# Patient Record
Sex: Female | Born: 1965 | Race: Black or African American | Hispanic: No | Marital: Single | Smoking: Current every day smoker
Health system: Southern US, Community
[De-identification: ages and names within clinical notes are randomized; demographics above are authoritative.]

## PROBLEM LIST (undated history)

## (undated) DIAGNOSIS — F332 Major depressive disorder, recurrent severe without psychotic features: Secondary | ICD-10-CM

## (undated) DIAGNOSIS — T391X1A Poisoning by 4-Aminophenol derivatives, accidental (unintentional), initial encounter: Secondary | ICD-10-CM

## (undated) DIAGNOSIS — K219 Gastro-esophageal reflux disease without esophagitis: Secondary | ICD-10-CM

## (undated) DIAGNOSIS — R45851 Suicidal ideations: Secondary | ICD-10-CM

## (undated) DIAGNOSIS — F319 Bipolar disorder, unspecified: Secondary | ICD-10-CM

## (undated) DIAGNOSIS — I10 Essential (primary) hypertension: Secondary | ICD-10-CM

## (undated) DIAGNOSIS — M76899 Other specified enthesopathies of unspecified lower limb, excluding foot: Secondary | ICD-10-CM

## (undated) HISTORY — DX: Poisoning by 4-aminophenol derivatives, accidental (unintentional), initial encounter: T39.1X1A

## (undated) HISTORY — DX: Other specified enthesopathies of unspecified lower limb, excluding foot: M76.899

## (undated) HISTORY — DX: Suicidal ideations: R45.851

## (undated) HISTORY — DX: Major depressive disorder, recurrent severe without psychotic features: F33.2

## (undated) HISTORY — DX: Essential (primary) hypertension: I10

## (undated) HISTORY — PX: TONSILLECTOMY: SUR1361

## (undated) HISTORY — PX: BREAST SURGERY: SHX581

---

## 1998-05-22 ENCOUNTER — Emergency Department (HOSPITAL_COMMUNITY): Admission: EM | Admit: 1998-05-22 | Discharge: 1998-05-23 | Payer: Self-pay | Admitting: Emergency Medicine

## 1998-10-09 ENCOUNTER — Emergency Department (HOSPITAL_COMMUNITY): Admission: EM | Admit: 1998-10-09 | Discharge: 1998-10-09 | Payer: Self-pay | Admitting: Emergency Medicine

## 2000-02-01 ENCOUNTER — Emergency Department (HOSPITAL_COMMUNITY): Admission: EM | Admit: 2000-02-01 | Discharge: 2000-02-01 | Payer: Self-pay | Admitting: Emergency Medicine

## 2000-02-03 ENCOUNTER — Encounter: Admission: RE | Admit: 2000-02-03 | Discharge: 2000-02-03 | Payer: Self-pay | Admitting: Emergency Medicine

## 2000-02-03 ENCOUNTER — Encounter: Payer: Self-pay | Admitting: Emergency Medicine

## 2000-09-14 ENCOUNTER — Emergency Department (HOSPITAL_COMMUNITY): Admission: EM | Admit: 2000-09-14 | Discharge: 2000-09-14 | Payer: Self-pay | Admitting: Emergency Medicine

## 2000-12-15 ENCOUNTER — Emergency Department (HOSPITAL_COMMUNITY): Admission: EM | Admit: 2000-12-15 | Discharge: 2000-12-15 | Payer: Self-pay | Admitting: Emergency Medicine

## 2001-02-14 ENCOUNTER — Encounter: Admission: RE | Admit: 2001-02-14 | Discharge: 2001-02-14 | Payer: Self-pay | Admitting: Emergency Medicine

## 2001-02-14 ENCOUNTER — Encounter: Payer: Self-pay | Admitting: Emergency Medicine

## 2001-02-27 ENCOUNTER — Encounter: Payer: Self-pay | Admitting: Emergency Medicine

## 2001-02-27 ENCOUNTER — Emergency Department (HOSPITAL_COMMUNITY): Admission: EM | Admit: 2001-02-27 | Discharge: 2001-02-27 | Payer: Self-pay | Admitting: Emergency Medicine

## 2002-01-30 ENCOUNTER — Emergency Department (HOSPITAL_COMMUNITY): Admission: EM | Admit: 2002-01-30 | Discharge: 2002-01-30 | Payer: Self-pay | Admitting: Emergency Medicine

## 2002-07-02 ENCOUNTER — Emergency Department (HOSPITAL_COMMUNITY): Admission: EM | Admit: 2002-07-02 | Discharge: 2002-07-02 | Payer: Self-pay | Admitting: Emergency Medicine

## 2002-07-08 ENCOUNTER — Emergency Department (HOSPITAL_COMMUNITY): Admission: EM | Admit: 2002-07-08 | Discharge: 2002-07-08 | Payer: Self-pay | Admitting: Emergency Medicine

## 2002-07-23 ENCOUNTER — Encounter: Admission: RE | Admit: 2002-07-23 | Discharge: 2002-07-23 | Payer: Self-pay | Admitting: Internal Medicine

## 2002-07-24 ENCOUNTER — Encounter: Payer: Self-pay | Admitting: Internal Medicine

## 2002-07-24 ENCOUNTER — Ambulatory Visit (HOSPITAL_COMMUNITY): Admission: RE | Admit: 2002-07-24 | Discharge: 2002-07-24 | Payer: Self-pay | Admitting: Internal Medicine

## 2002-10-30 ENCOUNTER — Encounter: Admission: RE | Admit: 2002-10-30 | Discharge: 2002-10-30 | Payer: Self-pay | Admitting: Internal Medicine

## 2003-06-03 ENCOUNTER — Encounter: Admission: RE | Admit: 2003-06-03 | Discharge: 2003-06-03 | Payer: Self-pay | Admitting: Internal Medicine

## 2003-07-01 ENCOUNTER — Encounter: Admission: RE | Admit: 2003-07-01 | Discharge: 2003-07-01 | Payer: Self-pay | Admitting: Internal Medicine

## 2003-08-19 ENCOUNTER — Encounter: Admission: RE | Admit: 2003-08-19 | Discharge: 2003-08-19 | Payer: Self-pay | Admitting: Internal Medicine

## 2003-10-15 ENCOUNTER — Encounter: Admission: RE | Admit: 2003-10-15 | Discharge: 2003-10-15 | Payer: Self-pay | Admitting: Internal Medicine

## 2003-10-21 ENCOUNTER — Ambulatory Visit (HOSPITAL_COMMUNITY): Admission: RE | Admit: 2003-10-21 | Discharge: 2003-10-21 | Payer: Self-pay | Admitting: Internal Medicine

## 2003-12-01 ENCOUNTER — Encounter: Admission: RE | Admit: 2003-12-01 | Discharge: 2003-12-01 | Payer: Self-pay | Admitting: Internal Medicine

## 2004-01-14 ENCOUNTER — Ambulatory Visit (HOSPITAL_COMMUNITY): Admission: RE | Admit: 2004-01-14 | Discharge: 2004-01-14 | Payer: Self-pay | Admitting: *Deleted

## 2004-01-21 ENCOUNTER — Encounter: Admission: RE | Admit: 2004-01-21 | Discharge: 2004-01-21 | Payer: Self-pay | Admitting: *Deleted

## 2004-01-21 ENCOUNTER — Encounter (INDEPENDENT_AMBULATORY_CARE_PROVIDER_SITE_OTHER): Payer: Self-pay | Admitting: *Deleted

## 2004-01-29 ENCOUNTER — Encounter: Admission: RE | Admit: 2004-01-29 | Discharge: 2004-01-29 | Payer: Self-pay | Admitting: Internal Medicine

## 2004-02-09 ENCOUNTER — Encounter: Admission: RE | Admit: 2004-02-09 | Discharge: 2004-02-09 | Payer: Self-pay | Admitting: Internal Medicine

## 2004-10-19 ENCOUNTER — Ambulatory Visit: Payer: Self-pay | Admitting: Internal Medicine

## 2004-11-15 ENCOUNTER — Ambulatory Visit: Payer: Self-pay | Admitting: Internal Medicine

## 2004-12-20 ENCOUNTER — Ambulatory Visit (HOSPITAL_BASED_OUTPATIENT_CLINIC_OR_DEPARTMENT_OTHER): Admission: RE | Admit: 2004-12-20 | Discharge: 2004-12-20 | Payer: Self-pay | Admitting: Internal Medicine

## 2005-01-01 ENCOUNTER — Ambulatory Visit: Payer: Self-pay | Admitting: Internal Medicine

## 2006-10-05 ENCOUNTER — Ambulatory Visit: Payer: Self-pay | Admitting: Internal Medicine

## 2006-10-08 ENCOUNTER — Ambulatory Visit: Payer: Self-pay | Admitting: *Deleted

## 2006-10-10 ENCOUNTER — Encounter: Admission: RE | Admit: 2006-10-10 | Discharge: 2006-10-10 | Payer: Self-pay | Admitting: Internal Medicine

## 2006-12-14 ENCOUNTER — Ambulatory Visit: Payer: Self-pay | Admitting: Internal Medicine

## 2006-12-14 ENCOUNTER — Encounter (INDEPENDENT_AMBULATORY_CARE_PROVIDER_SITE_OTHER): Payer: Self-pay | Admitting: Internal Medicine

## 2006-12-14 LAB — CONVERTED CEMR LAB
Bilirubin Urine: NEGATIVE
Chlamydia, DNA Probe: NEGATIVE
Leukocytes, UA: NEGATIVE
Protein, ur: NEGATIVE mg/dL
Specific Gravity, Urine: 1.03
Urine Glucose: NEGATIVE mg/dL
Urobilinogen, UA: 0.2

## 2006-12-20 ENCOUNTER — Ambulatory Visit (HOSPITAL_COMMUNITY): Admission: RE | Admit: 2006-12-20 | Discharge: 2006-12-20 | Payer: Self-pay | Admitting: Internal Medicine

## 2007-01-03 ENCOUNTER — Encounter (INDEPENDENT_AMBULATORY_CARE_PROVIDER_SITE_OTHER): Payer: Self-pay | Admitting: General Surgery

## 2007-01-03 ENCOUNTER — Encounter (INDEPENDENT_AMBULATORY_CARE_PROVIDER_SITE_OTHER): Payer: Self-pay | Admitting: Internal Medicine

## 2007-01-03 ENCOUNTER — Ambulatory Visit (HOSPITAL_COMMUNITY): Admission: RE | Admit: 2007-01-03 | Discharge: 2007-01-03 | Payer: Self-pay | Admitting: General Surgery

## 2007-01-17 ENCOUNTER — Ambulatory Visit: Payer: Self-pay | Admitting: Internal Medicine

## 2007-01-17 DIAGNOSIS — M76899 Other specified enthesopathies of unspecified lower limb, excluding foot: Secondary | ICD-10-CM

## 2007-01-17 HISTORY — DX: Other specified enthesopathies of unspecified lower limb, excluding foot: M76.899

## 2007-01-17 LAB — CONVERTED CEMR LAB
HCT: 38.9 % (ref 36.0–46.0)
Lymphs Abs: 2.1 10*3/uL (ref 0.7–3.3)
MCV: 92.8 fL (ref 78.0–100.0)
Monocytes Absolute: 0.3 10*3/uL (ref 0.2–0.7)
Monocytes Relative: 4 % (ref 3–11)
Neutro Abs: 3.5 10*3/uL (ref 1.7–7.7)
Neutrophils Relative %: 60 % (ref 43–77)

## 2007-01-18 ENCOUNTER — Encounter (INDEPENDENT_AMBULATORY_CARE_PROVIDER_SITE_OTHER): Payer: Self-pay | Admitting: Internal Medicine

## 2007-01-18 LAB — CONVERTED CEMR LAB
Albumin: 3.9 g/dL (ref 3.5–5.2)
BUN: 12 mg/dL (ref 6–23)
CO2: 24 meq/L (ref 19–32)
Calcium: 8.9 mg/dL (ref 8.4–10.5)
Chloride: 107 meq/L (ref 96–112)
Glucose, Bld: 90 mg/dL (ref 70–99)
Potassium: 4.8 meq/L (ref 3.5–5.3)
Sodium: 143 meq/L (ref 135–145)
Total Protein: 6.7 g/dL (ref 6.0–8.3)

## 2007-01-23 ENCOUNTER — Encounter (INDEPENDENT_AMBULATORY_CARE_PROVIDER_SITE_OTHER): Payer: Self-pay | Admitting: Internal Medicine

## 2007-01-23 LAB — CONVERTED CEMR LAB
Amylase: 63 units/L (ref 0–105)
Lipase: 36 units/L (ref 0–75)

## 2007-01-25 ENCOUNTER — Ambulatory Visit (HOSPITAL_COMMUNITY): Admission: RE | Admit: 2007-01-25 | Discharge: 2007-01-25 | Payer: Self-pay | Admitting: Internal Medicine

## 2007-01-25 DIAGNOSIS — D219 Benign neoplasm of connective and other soft tissue, unspecified: Secondary | ICD-10-CM

## 2007-01-25 DIAGNOSIS — N83209 Unspecified ovarian cyst, unspecified side: Secondary | ICD-10-CM

## 2007-01-29 ENCOUNTER — Encounter: Admission: RE | Admit: 2007-01-29 | Discharge: 2007-01-29 | Payer: Self-pay | Admitting: Internal Medicine

## 2007-01-29 ENCOUNTER — Encounter (INDEPENDENT_AMBULATORY_CARE_PROVIDER_SITE_OTHER): Payer: Self-pay | Admitting: Interventional Radiology

## 2007-01-29 ENCOUNTER — Other Ambulatory Visit: Admission: RE | Admit: 2007-01-29 | Discharge: 2007-01-29 | Payer: Self-pay | Admitting: Interventional Radiology

## 2007-02-05 ENCOUNTER — Encounter: Admission: RE | Admit: 2007-02-05 | Discharge: 2007-03-12 | Payer: Self-pay | Admitting: Internal Medicine

## 2007-02-05 ENCOUNTER — Encounter (INDEPENDENT_AMBULATORY_CARE_PROVIDER_SITE_OTHER): Payer: Self-pay | Admitting: Internal Medicine

## 2007-02-11 ENCOUNTER — Encounter: Payer: Self-pay | Admitting: Internal Medicine

## 2007-02-11 DIAGNOSIS — E042 Nontoxic multinodular goiter: Secondary | ICD-10-CM

## 2007-02-11 DIAGNOSIS — M545 Low back pain, unspecified: Secondary | ICD-10-CM | POA: Insufficient documentation

## 2007-02-11 DIAGNOSIS — K219 Gastro-esophageal reflux disease without esophagitis: Secondary | ICD-10-CM | POA: Insufficient documentation

## 2007-02-11 DIAGNOSIS — F319 Bipolar disorder, unspecified: Secondary | ICD-10-CM

## 2007-02-11 DIAGNOSIS — E669 Obesity, unspecified: Secondary | ICD-10-CM | POA: Insufficient documentation

## 2007-02-11 DIAGNOSIS — R945 Abnormal results of liver function studies: Secondary | ICD-10-CM

## 2007-02-11 DIAGNOSIS — G4730 Sleep apnea, unspecified: Secondary | ICD-10-CM | POA: Insufficient documentation

## 2007-02-11 DIAGNOSIS — I10 Essential (primary) hypertension: Secondary | ICD-10-CM

## 2007-03-20 ENCOUNTER — Ambulatory Visit: Payer: Self-pay | Admitting: Obstetrics and Gynecology

## 2007-04-18 ENCOUNTER — Ambulatory Visit: Payer: Self-pay | Admitting: Internal Medicine

## 2007-04-25 LAB — CONVERTED CEMR LAB
AST: 55 units/L — ABNORMAL HIGH (ref 0–37)
Albumin: 4.1 g/dL (ref 3.5–5.2)
Bilirubin, Direct: 0.1 mg/dL (ref 0.0–0.3)
Total Bilirubin: 0.4 mg/dL (ref 0.3–1.2)

## 2007-05-09 ENCOUNTER — Encounter (INDEPENDENT_AMBULATORY_CARE_PROVIDER_SITE_OTHER): Payer: Self-pay | Admitting: Internal Medicine

## 2007-05-09 ENCOUNTER — Ambulatory Visit (HOSPITAL_BASED_OUTPATIENT_CLINIC_OR_DEPARTMENT_OTHER): Admission: RE | Admit: 2007-05-09 | Discharge: 2007-05-09 | Payer: Self-pay | Admitting: Internal Medicine

## 2007-05-10 ENCOUNTER — Ambulatory Visit (HOSPITAL_COMMUNITY): Admission: RE | Admit: 2007-05-10 | Discharge: 2007-05-10 | Payer: Self-pay | Admitting: Internal Medicine

## 2007-05-23 ENCOUNTER — Encounter (INDEPENDENT_AMBULATORY_CARE_PROVIDER_SITE_OTHER): Payer: Self-pay | Admitting: Internal Medicine

## 2007-06-02 ENCOUNTER — Encounter (INDEPENDENT_AMBULATORY_CARE_PROVIDER_SITE_OTHER): Payer: Self-pay | Admitting: Internal Medicine

## 2007-06-07 ENCOUNTER — Ambulatory Visit: Payer: Self-pay | Admitting: Internal Medicine

## 2007-06-26 ENCOUNTER — Telehealth (INDEPENDENT_AMBULATORY_CARE_PROVIDER_SITE_OTHER): Payer: Self-pay | Admitting: Internal Medicine

## 2007-07-12 ENCOUNTER — Ambulatory Visit: Payer: Self-pay | Admitting: Internal Medicine

## 2007-07-16 ENCOUNTER — Ambulatory Visit (HOSPITAL_COMMUNITY): Admission: RE | Admit: 2007-07-16 | Discharge: 2007-07-16 | Payer: Self-pay | Admitting: Internal Medicine

## 2007-08-06 ENCOUNTER — Ambulatory Visit: Payer: Self-pay | Admitting: Internal Medicine

## 2007-08-29 ENCOUNTER — Ambulatory Visit: Payer: Self-pay | Admitting: Nurse Practitioner

## 2007-08-29 DIAGNOSIS — B9789 Other viral agents as the cause of diseases classified elsewhere: Secondary | ICD-10-CM | POA: Insufficient documentation

## 2007-08-29 LAB — CONVERTED CEMR LAB
BUN: 10 mg/dL (ref 6–23)
CO2: 23 meq/L (ref 19–32)
Calcium: 9.4 mg/dL (ref 8.4–10.5)
Creatinine, Ser: 0.74 mg/dL (ref 0.40–1.20)
Eosinophils Absolute: 0.1 10*3/uL (ref 0.0–0.7)
Eosinophils Relative: 6 % — ABNORMAL HIGH (ref 0–5)
Glucose, Bld: 82 mg/dL (ref 70–99)
HCT: 44.2 % (ref 36.0–46.0)
Hemoglobin: 14.5 g/dL (ref 12.0–15.0)
Lymphocytes Relative: 40 % (ref 12–46)
Lymphs Abs: 0.8 10*3/uL (ref 0.7–4.0)
MCV: 96.3 fL (ref 78.0–100.0)
Monocytes Absolute: 0.5 10*3/uL (ref 0.1–1.0)
Monocytes Relative: 26 % — ABNORMAL HIGH (ref 3–12)
Sodium: 142 meq/L (ref 135–145)
WBC: 2 10*3/uL — ABNORMAL LOW (ref 4.0–10.5)

## 2007-08-30 ENCOUNTER — Telehealth (INDEPENDENT_AMBULATORY_CARE_PROVIDER_SITE_OTHER): Payer: Self-pay | Admitting: Nurse Practitioner

## 2007-12-06 ENCOUNTER — Encounter (INDEPENDENT_AMBULATORY_CARE_PROVIDER_SITE_OTHER): Payer: Self-pay | Admitting: *Deleted

## 2008-03-05 ENCOUNTER — Telehealth (INDEPENDENT_AMBULATORY_CARE_PROVIDER_SITE_OTHER): Payer: Self-pay | Admitting: Internal Medicine

## 2008-03-17 ENCOUNTER — Ambulatory Visit: Payer: Self-pay | Admitting: Internal Medicine

## 2008-03-17 DIAGNOSIS — L0292 Furuncle, unspecified: Secondary | ICD-10-CM | POA: Insufficient documentation

## 2008-03-17 DIAGNOSIS — L0293 Carbuncle, unspecified: Secondary | ICD-10-CM

## 2008-03-24 ENCOUNTER — Encounter (INDEPENDENT_AMBULATORY_CARE_PROVIDER_SITE_OTHER): Payer: Self-pay | Admitting: Internal Medicine

## 2008-03-30 LAB — CONVERTED CEMR LAB
Basophils Absolute: 0 10*3/uL (ref 0.0–0.1)
Eosinophils Relative: 7 % — ABNORMAL HIGH (ref 0–5)
HCT: 43.8 % (ref 36.0–46.0)
Lymphocytes Relative: 32 % (ref 12–46)
Lymphs Abs: 1.8 10*3/uL (ref 0.7–4.0)
Neutrophils Relative %: 50 % (ref 43–77)
Platelets: 291 10*3/uL (ref 150–400)
RDW: 13 % (ref 11.5–15.5)
Sed Rate: 34 mm/hr — ABNORMAL HIGH (ref 0–22)
Total CK: 42 units/L (ref 7–177)
WBC: 5.6 10*3/uL (ref 4.0–10.5)

## 2008-04-03 ENCOUNTER — Ambulatory Visit: Payer: Self-pay | Admitting: Internal Medicine

## 2008-04-03 LAB — CONVERTED CEMR LAB: Free T4: 1.42 ng/dL (ref 0.89–1.80)

## 2008-04-13 ENCOUNTER — Telehealth (INDEPENDENT_AMBULATORY_CARE_PROVIDER_SITE_OTHER): Payer: Self-pay | Admitting: *Deleted

## 2008-05-11 ENCOUNTER — Ambulatory Visit: Payer: Self-pay | Admitting: Internal Medicine

## 2008-10-26 IMAGING — US US SOFT TISSUE HEAD/NECK
1 series · 13 of 25 positions shown · non-contrast
Comparison: None available.

CLINICAL DATA: Thyroid nodule.
 THYROID ULTRASOUND:
TECHNIQUE: Ultrasound examination of the thyroid gland and adjacent soft tissue structures was performed.

[Series 1: unknown · 0.11mm/px · 13 of 49 slices shown]
[im 1/49]
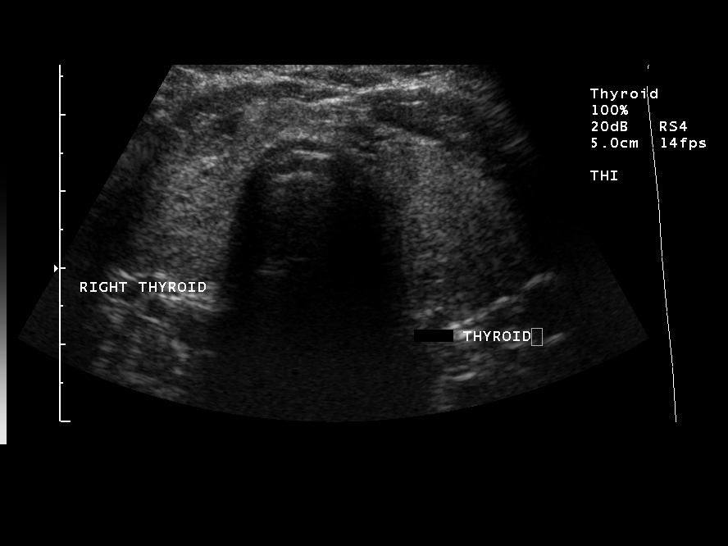
[im 5/49]
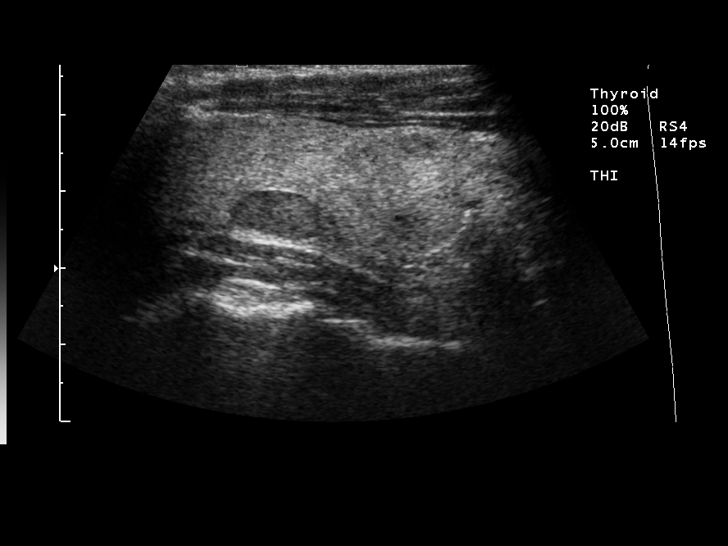
[im 9/49]
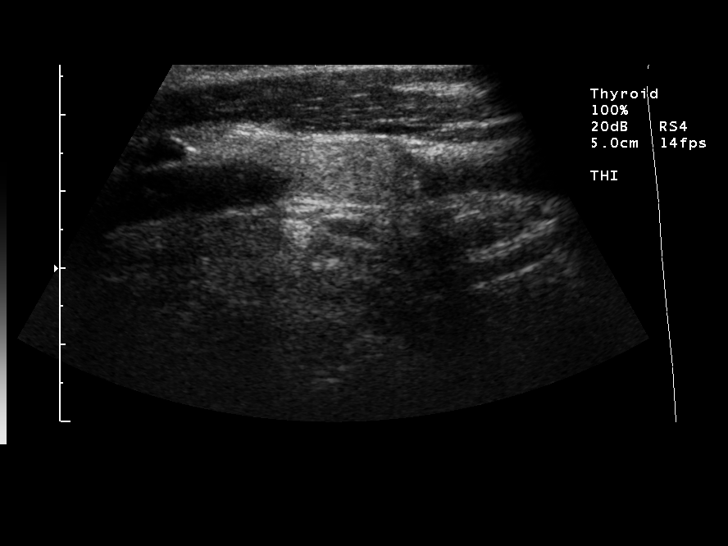
[im 13/49]
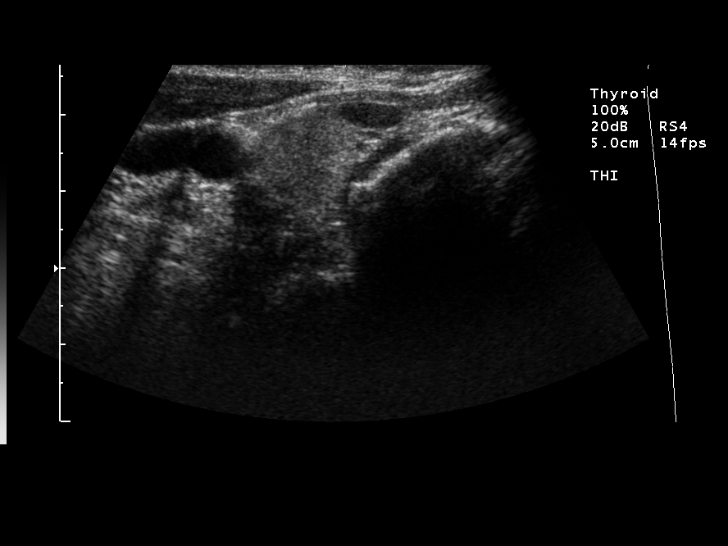
[im 17/49]
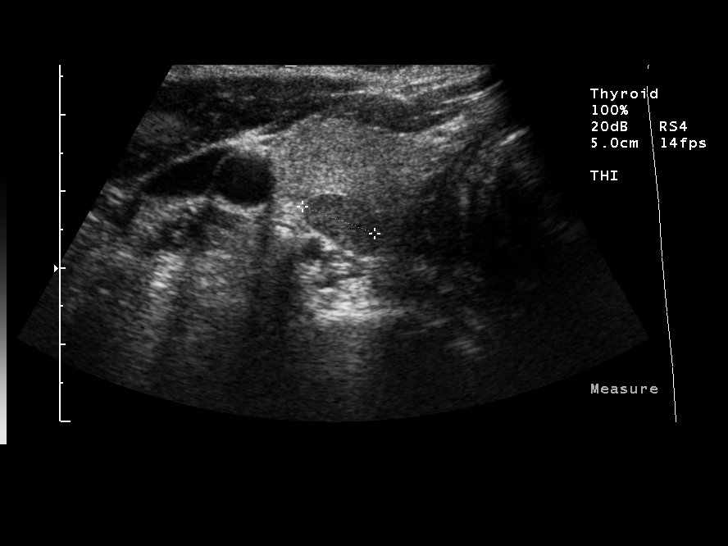
[im 21/49]
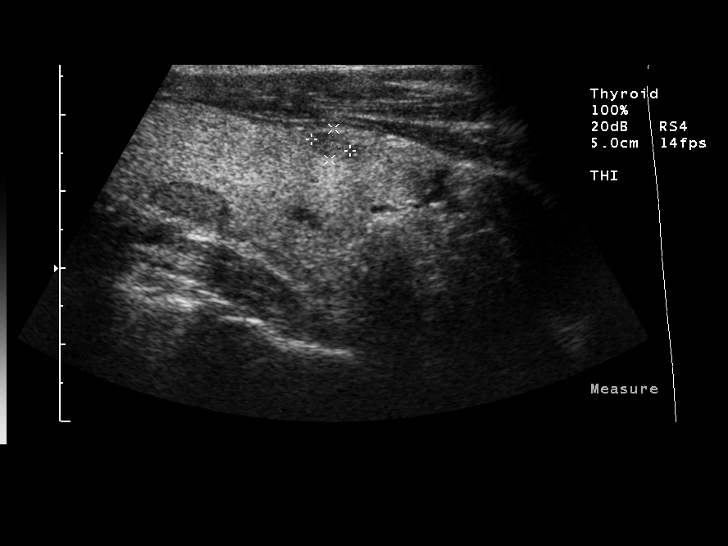
[im 25/49]
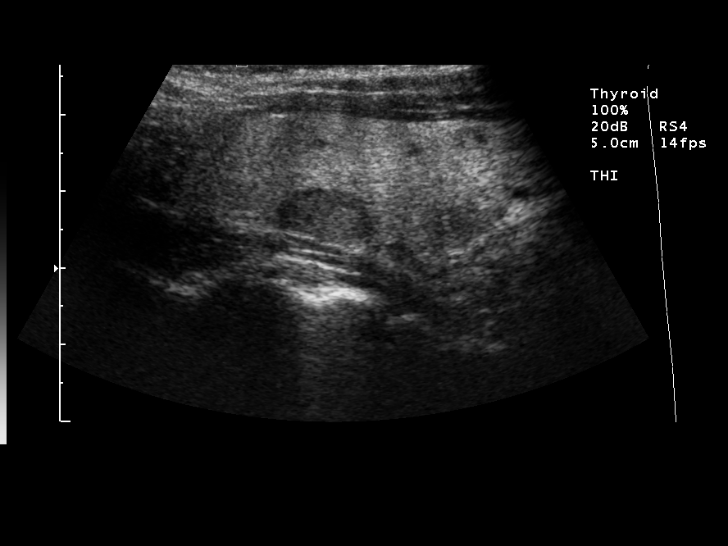
[im 29/49]
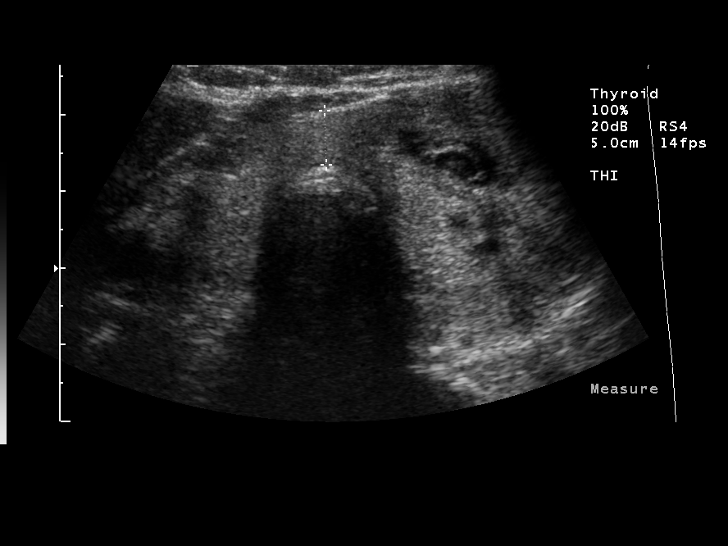
[im 33/49]
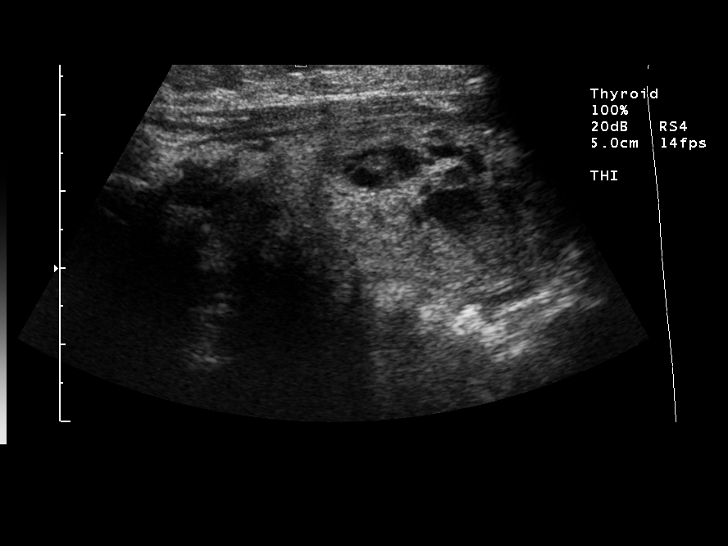
[im 37/49]
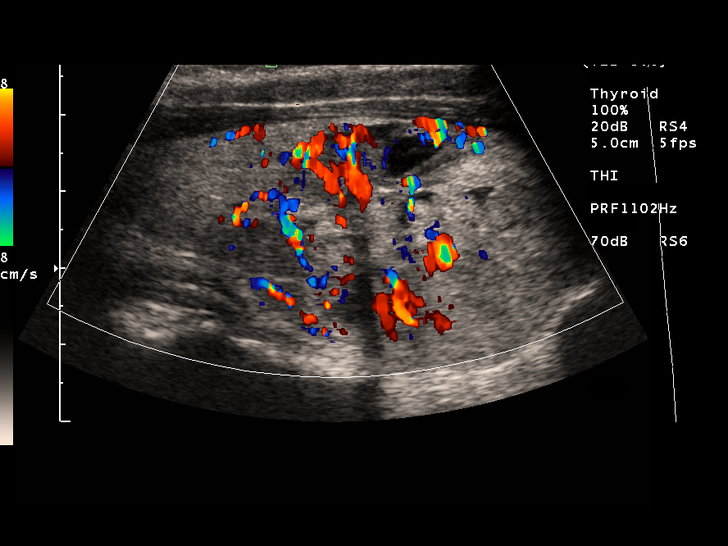
[im 41/49]
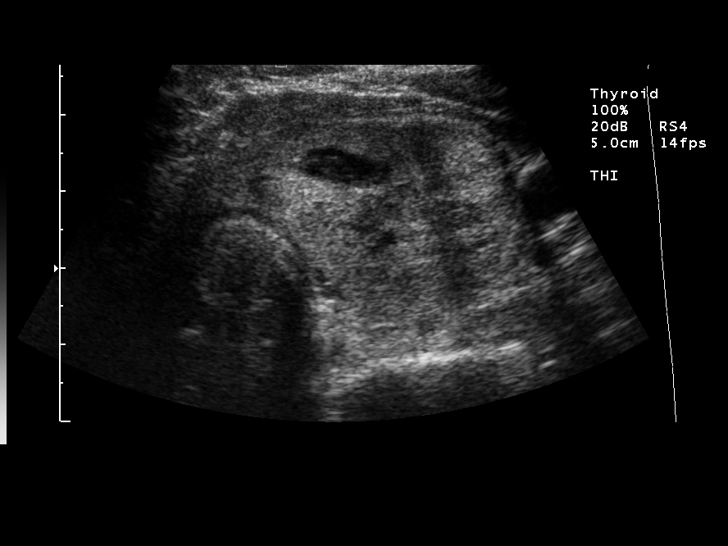
[im 45/49]
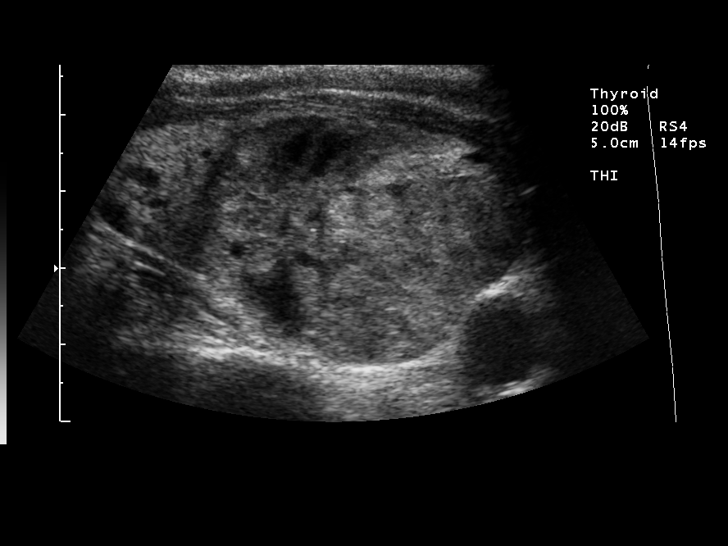
[im 49/49]
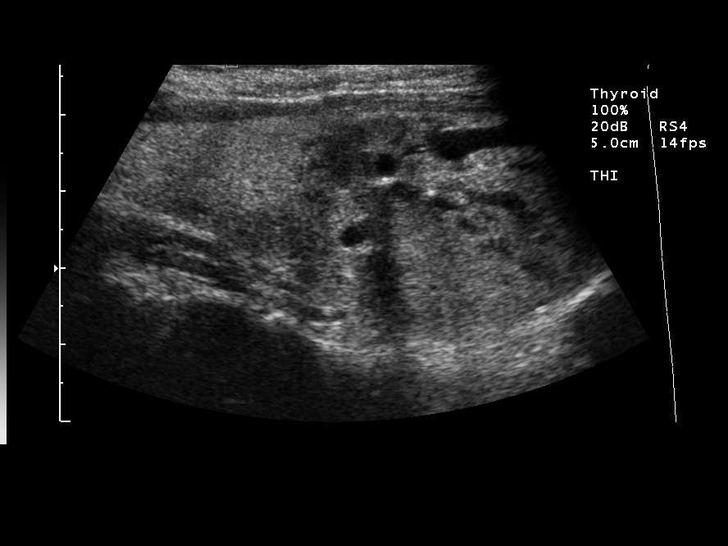

[13 of 25 positions shown; findings below may reference images not displayed]

FINDINGS: The thyroid is diffusely heterogeneous including both left and right thyroid lobes. A dominant nodule is identified in the left thyroid lobe. This has mixed cystic and solid echogenicity.  The left thyroid lobe measures 7.0 x 3.0 x 3.1 cm.  The isthmus appears normal. The right thyroid lobe measures 5.0 x 2.2 x 1.8 cm. 
 Qualitatively, the left thyroid nodule is 4.7 x 3.2 x 4.3 cm, with a central solid echotexture and small islands of hypoechogenicity.  No definite calcifications are identified or lymphadenopathy in the soft tissues around the thyroid. The visualized portions of the great vessels are normal. 
 The right thyroid lobe also demonstrates nodules, with a larger interpolar nodule measuring 1.2 x 0.7 x 1.0 cm, which is hypoechoic without internal calcifications.  A second smaller lesion is identified measuring 5 mm x 4 mm x 5 mm in the inferomedial right thyroid lobe.
IMPRESSION: Heterogeneous thyroid with bilateral thyroid nodules. The largest is in the left thyroid lobe and is of a size that biopsy should be considered.  If this is a palpable nodule, percutaneous biopsy may be performed and if image guidance is required, ultrasound is a possibility.

## 2008-10-26 IMAGING — CR DG HIP (WITH OR WITHOUT PELVIS) 2-3V*L*
3 series · 3 of 3 positions shown · non-contrast
Comparison: none

CLINICAL DATA: Fall, left hip pain. 
 LEFT HIP ? 3 VIEW:

[t pelvis a.p.]
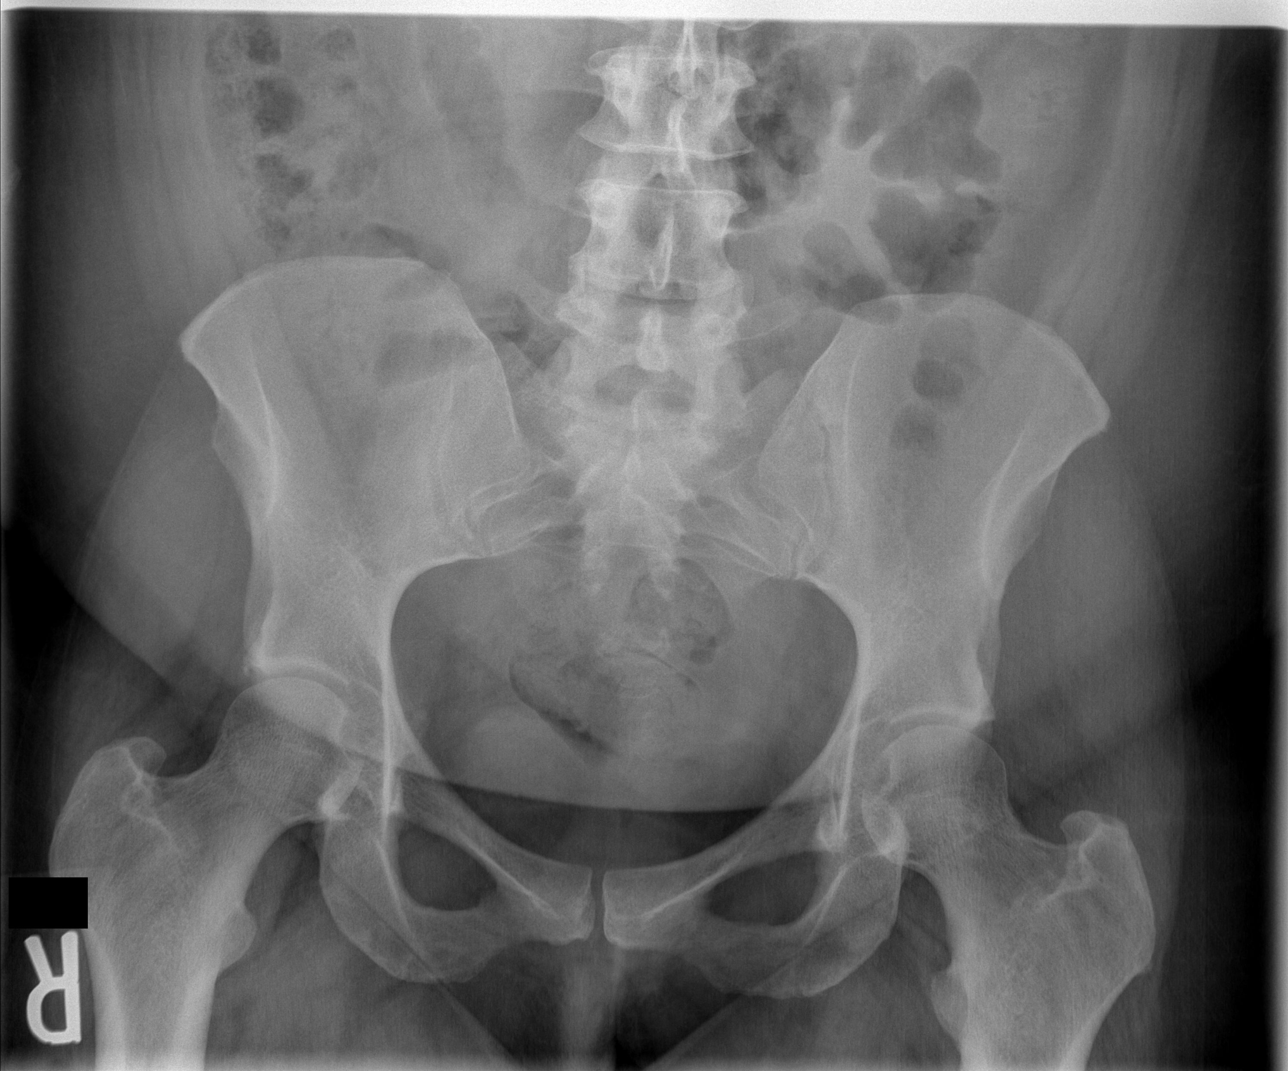

[t hip ap left]
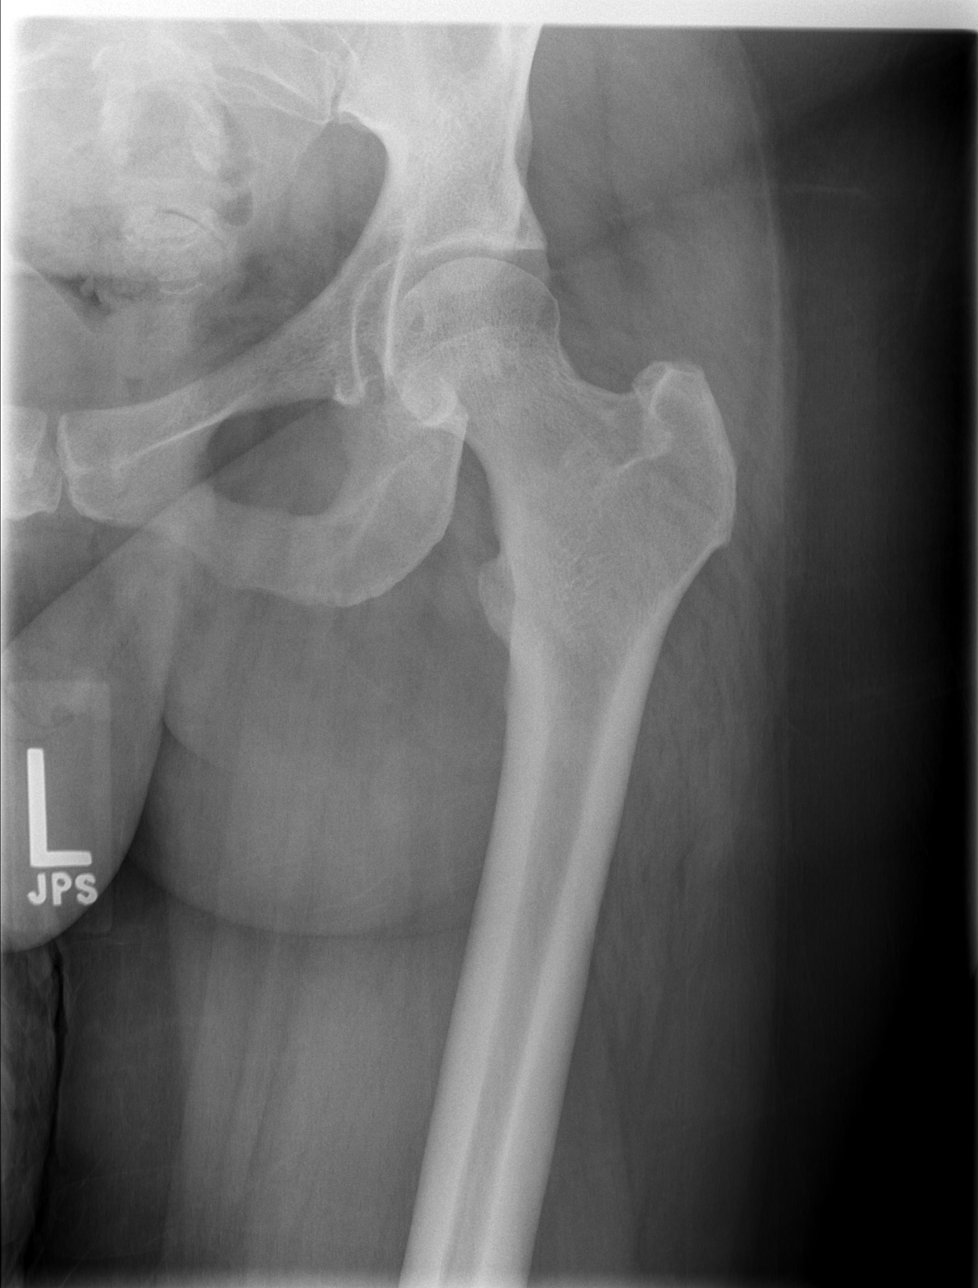

[t hip frog leg left]
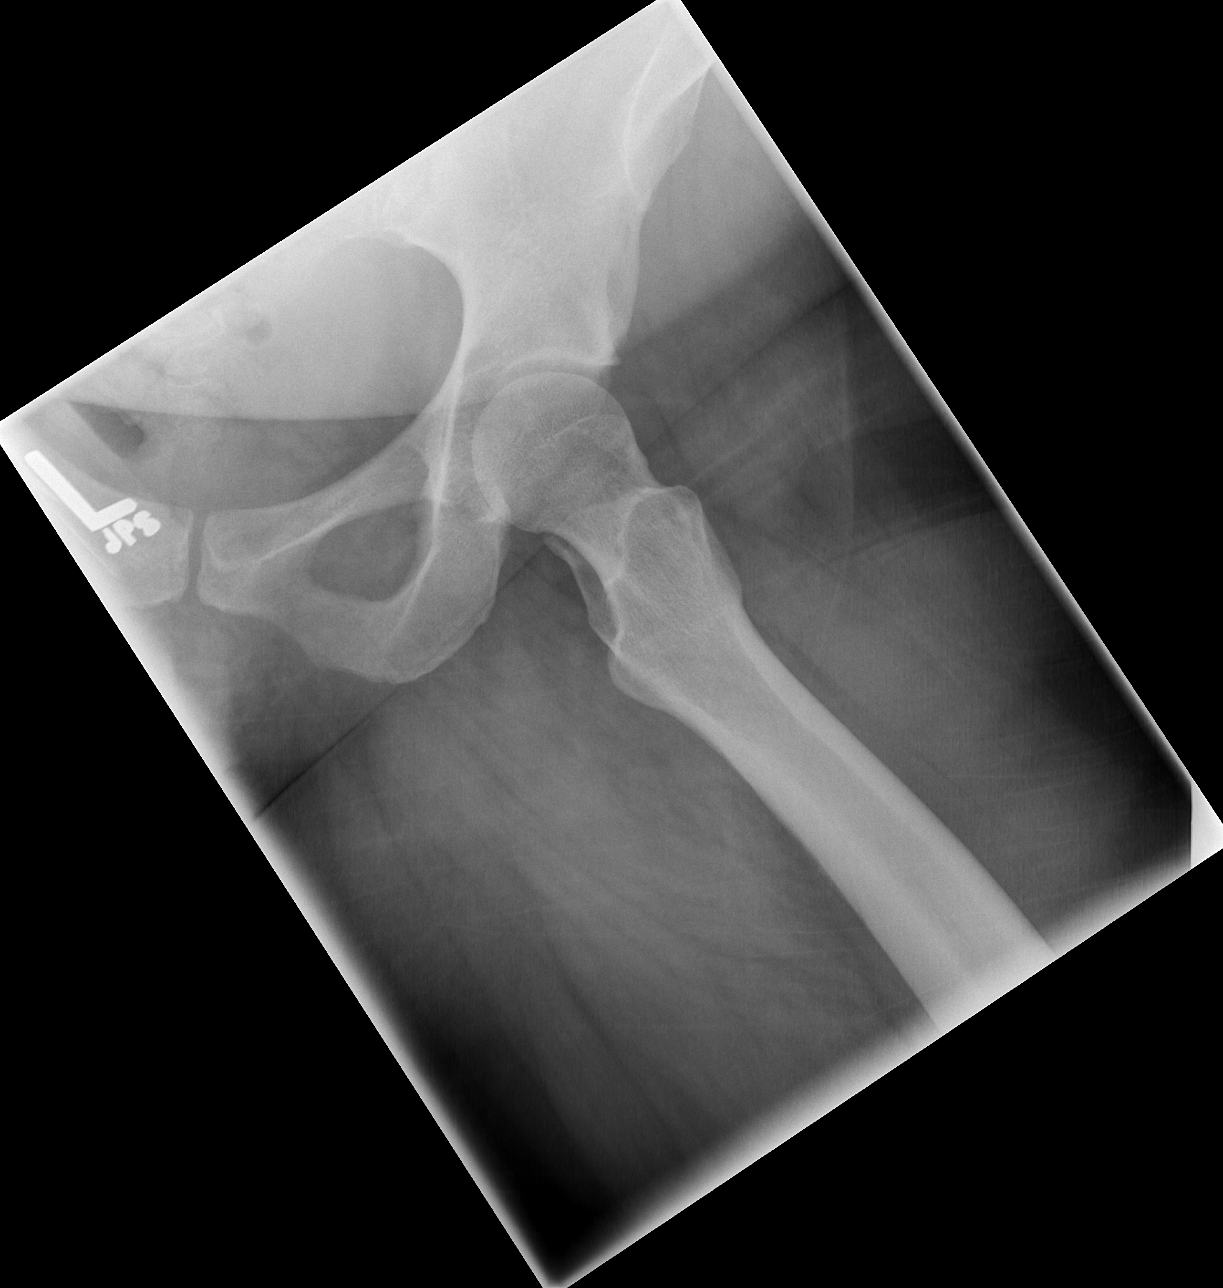

[3 of 3 positions shown; findings below may reference images not displayed]

FINDINGS: No evidence of fracture, dislocation or degenerative change.  The remainder of the bony pelvis appears likewise normal.
IMPRESSION: Normal radiographs.

## 2008-10-27 ENCOUNTER — Emergency Department (HOSPITAL_COMMUNITY): Admission: EM | Admit: 2008-10-27 | Discharge: 2008-10-27 | Payer: Self-pay | Admitting: Emergency Medicine

## 2008-10-28 ENCOUNTER — Telehealth (INDEPENDENT_AMBULATORY_CARE_PROVIDER_SITE_OTHER): Payer: Self-pay | Admitting: Internal Medicine

## 2008-11-08 IMAGING — CR DG CHEST 2V
2 series · 2 of 2 positions shown · non-contrast
Comparison: None.

CLINICAL DATA: Preadmission for right breast surgery.  
 CHEST - 2 VIEW:

[view not recorded (1 of 2)]
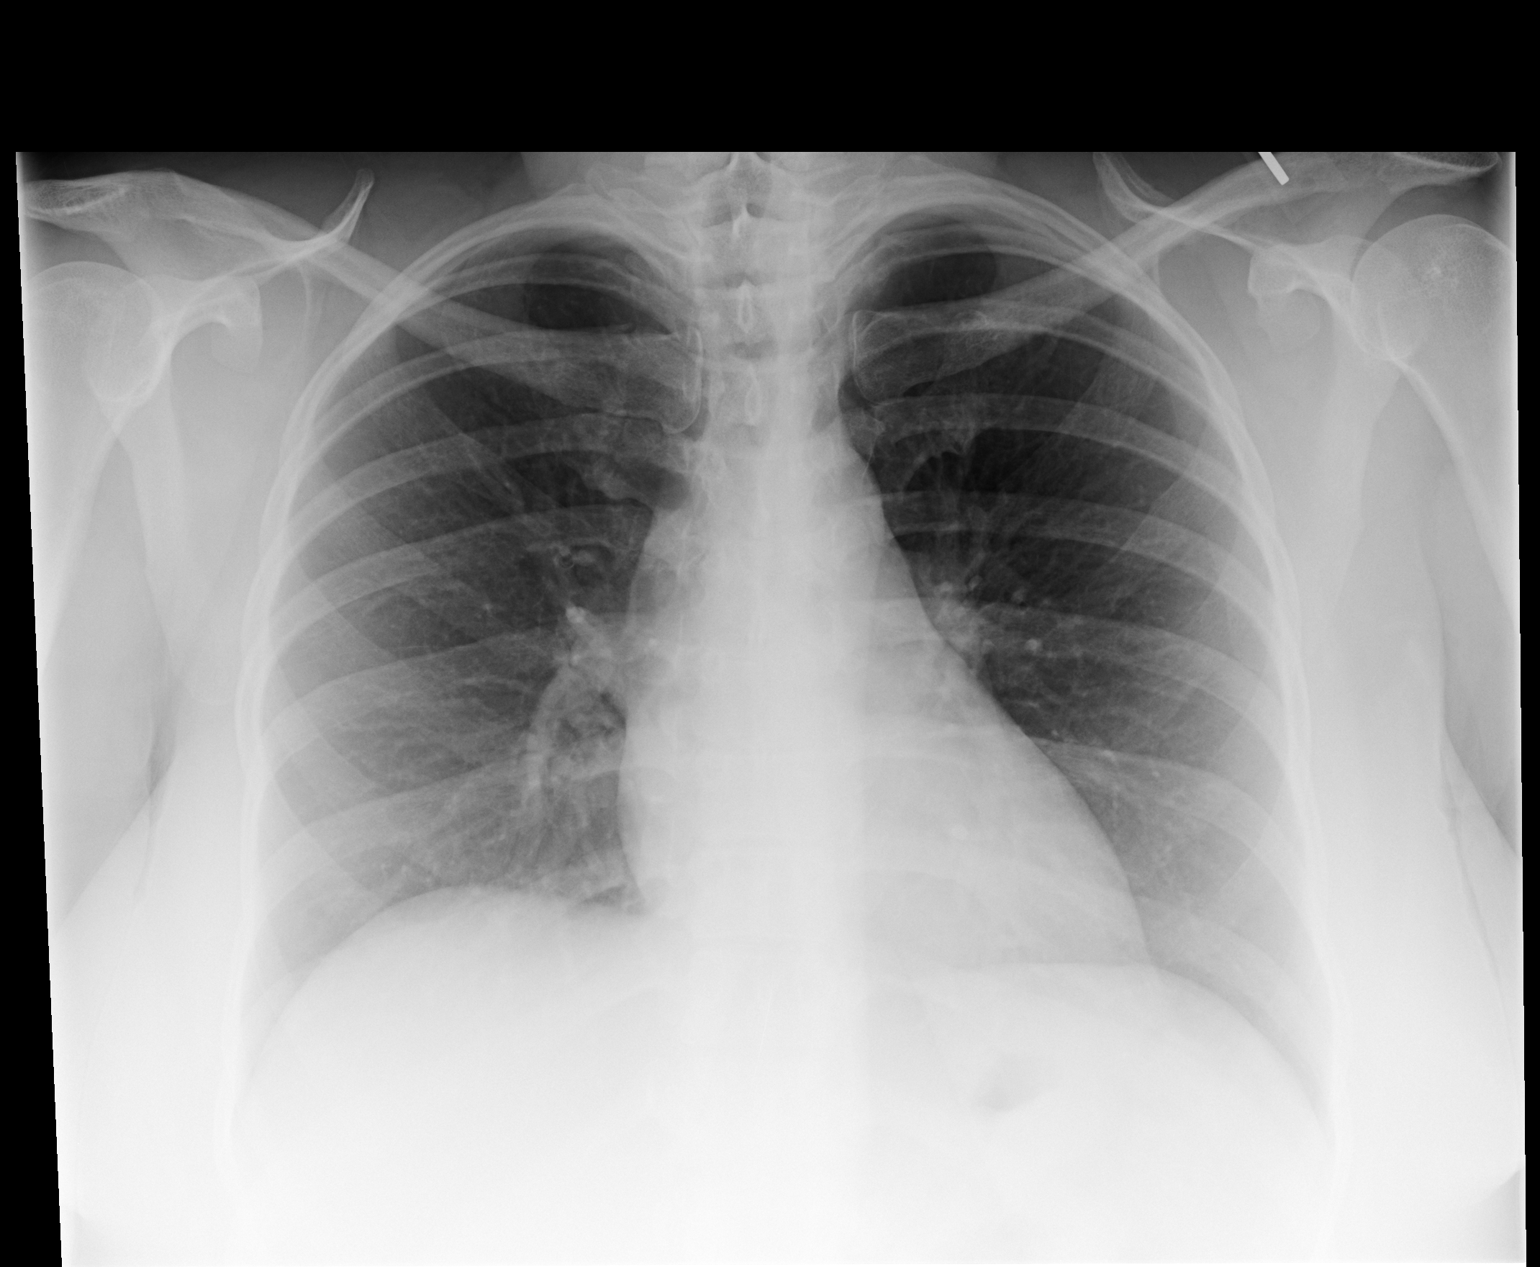

[view not recorded (2 of 2)]
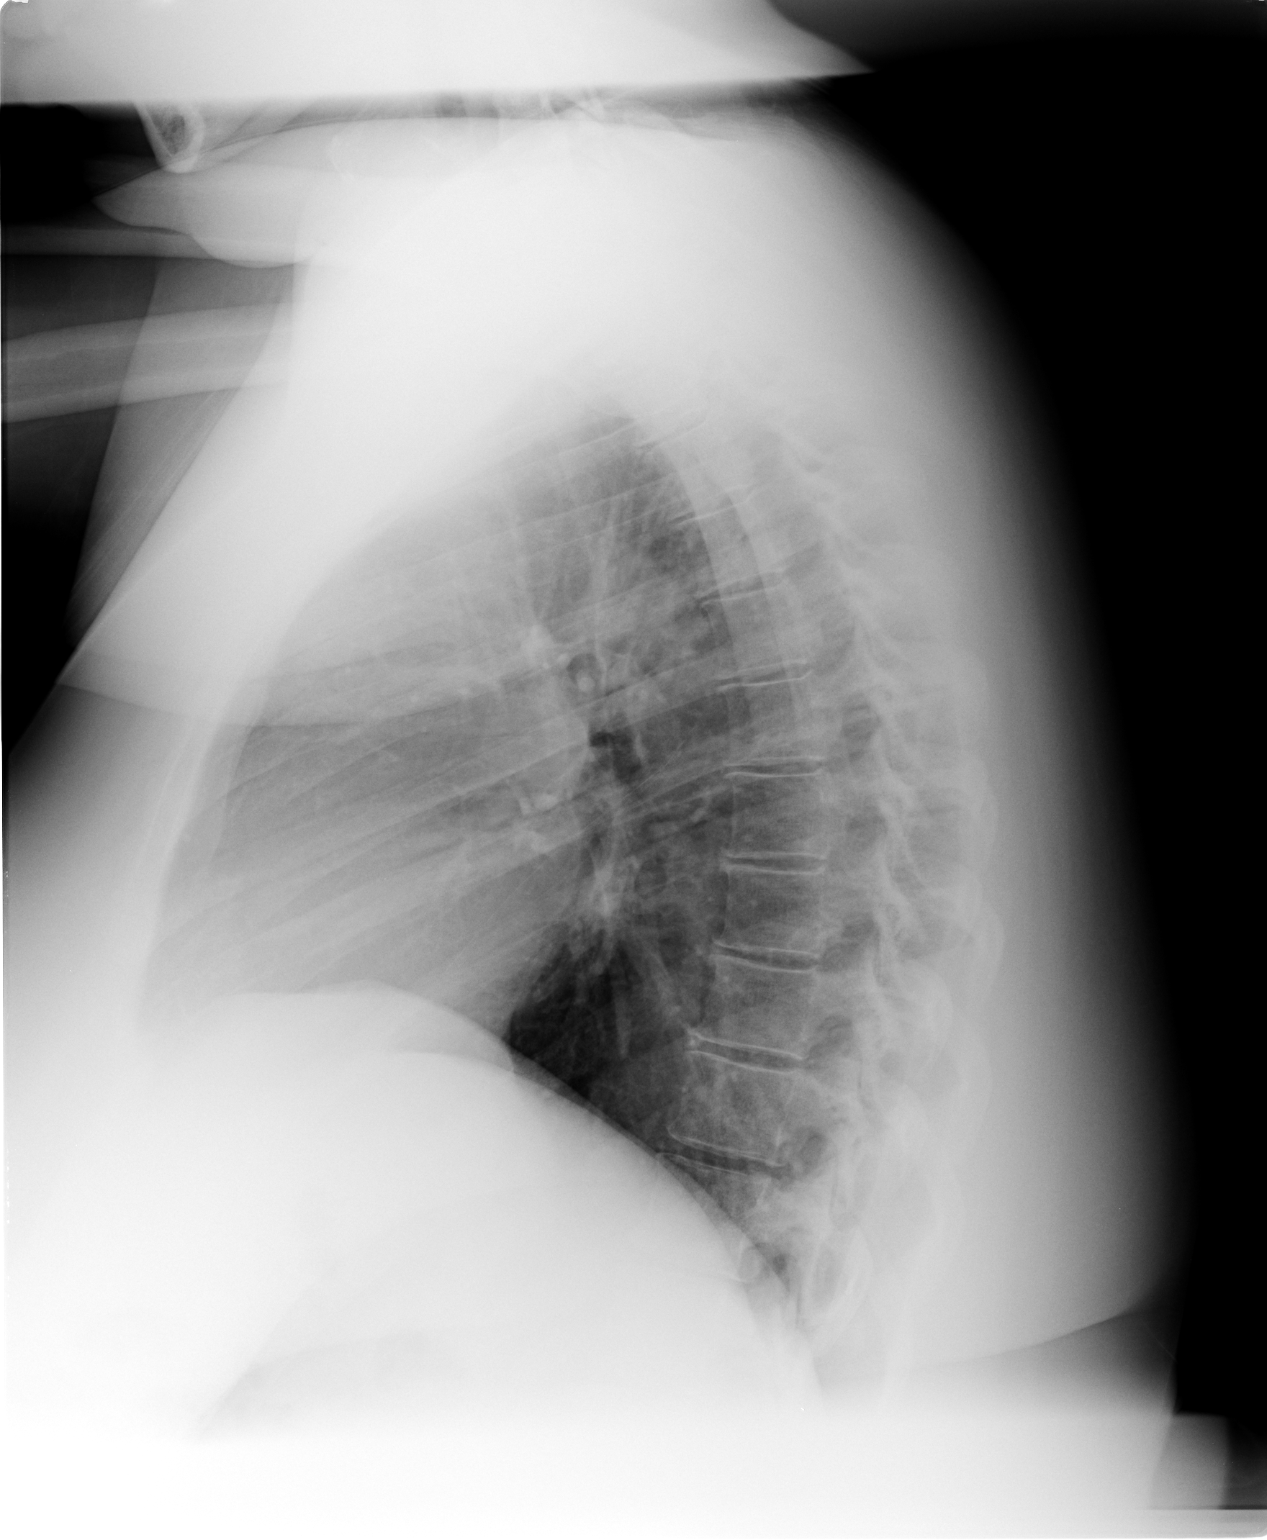

[2 of 2 positions shown; findings below may reference images not displayed]

FINDINGS: Trachea is midline.  Heart size normal.  Lungs are clear.  No pleural fluid.
IMPRESSION: No acute findings.

## 2010-04-27 ENCOUNTER — Emergency Department (HOSPITAL_COMMUNITY): Admission: EM | Admit: 2010-04-27 | Discharge: 2010-04-27 | Payer: Self-pay | Admitting: Emergency Medicine

## 2010-07-10 ENCOUNTER — Encounter: Payer: Self-pay | Admitting: Internal Medicine

## 2010-09-27 LAB — URINALYSIS, ROUTINE W REFLEX MICROSCOPIC
Bilirubin Urine: NEGATIVE
Leukocytes, UA: NEGATIVE
Nitrite: NEGATIVE
Specific Gravity, Urine: 1.016 (ref 1.005–1.030)
pH: 7.5 (ref 5.0–8.0)

## 2010-09-27 LAB — URINE MICROSCOPIC-ADD ON

## 2010-09-27 LAB — WET PREP, GENITAL
Trich, Wet Prep: NONE SEEN
WBC, Wet Prep HPF POC: NONE SEEN
Yeast Wet Prep HPF POC: NONE SEEN

## 2010-09-27 LAB — URINE CULTURE

## 2010-11-01 NOTE — Op Note (Signed)
NAME:  Debra [name], Debra [name]             ACCOUNT NO.:  [id number]   MEDICAL RECORD NO.:  [id number]          PATIENT TYPE:  AMB   LOCATION:  SDS                          FACILITY:  [facility]   PHYSICIAN:  Ollen Gross. Vernell Morgans, M.D. DATE OF BIRTH:  11/26/1965   DATE OF PROCEDURE:  01/03/2007  DATE OF DISCHARGE:                               OPERATIVE REPORT   PREOPERATIVE DIAGNOSIS:  Mass in the right breast.   POSTOPERATIVE DIAGNOSES:  Mass in the right breast.   PROCEDURE:  Right breast lumpectomy.   SURGEON:  Ollen Gross. Vernell Morgans, M.D.   ANESTHESIA:  General via LMA.   DESCRIPTION OF PROCEDURE:  After informed consent was obtained, the  patient was brought to the operating room and placed in the supine  position on the operating table.  After induction of general anesthesia,  the patient's right breast was prepped with Betadine and draped in the  usual sterile manner.  The area in the right breast was palpable in the  9 o'clock position.   A small radial incision was made overlying the palpable mass.  This  incision was carried down through the skin and subcutaneous tissue  sharply with the electrocautery.  Once into the breast tissue, a  circular portion of breast tissue was excised surrounding the mass.  This was done circumferentially.  Once the mass was completely freed  from the rest of breast tissue, it was marked with a short stitch  superior, and a long stitch lateral and sent to pathology.  Hemostasis  was achieved using the Bovie electrocautery.  The wound was irrigated  with copious amounts of saline.  The deep layer of the wound was then  closed with interrupted 3-0 Vicryl stitches; and the skin was closed  with a running 4-0  Monocryl subcuticular stitch.  Benzoin and Steri-  Strips and sterile dressings were applied.  The area was also  infiltrated with 1/4% Marcaine.  The patient tolerated the procedure  well.  At the end of the case all needle, sponge, and instrument counts  were correct.  The patient was then awakened and taken to recovery in  stable condition.      Ollen Gross. Vernell Morgans, M.D.  Electronically Signed     PST/MEDQ  D:  01/03/2007  T:  01/03/2007  Job:  [id number]

## 2010-11-01 NOTE — Procedures (Signed)
NAME:  Debra [name], Debra [name]             ACCOUNT NO.:  [id number]   MEDICAL RECORD NO.:  [id number]          PATIENT TYPE:  OUT   LOCATION:  SLEEP CENTER                 FACILITY:  [facility]   PHYSICIAN:  Clinton D. Maple Hudson, MD, FCCP, FACPDATE OF BIRTH:  1966-05-[birth year]   DATE OF STUDY:  05/09/2007                            NOCTURNAL POLYSOMNOGRAM   REFERRING PHYSICIAN:  Marcene Duos, M.D.   INDICATIONS FOR PROCEDURE:  Hypersomnia with sleep apnea.   RESULTS:  Epward sleepiness score 10/24, BMI 35.1, weight 238 pounds,  height 69 inches, neck 14.5 inches.   HOME MEDICATION:  Listed and reviewed. A diagnostic study was done on  December 20, 2004, recording an AHI of 18.9 per hour. CPAP titration protocol  is requested.   SLEEP ARCHITECTURE:  Total sleep time 380 minutes with sleep efficiency  94%. Stage 1 was 3%; Stage 2, 64%; Stage 3, 1%. REM 31% of total sleep  time. Sleep latency 7 minutes. REM latency 93 minutes. Awake after sleep  onset 14 minutes. Arousal index 2.2. No bedtime medication taken.   RESPIRATORY DATA:  CPAP titration protocol. The patient had some nasal  congestion but tolerated CPAP well. CPAP was titrated to 10 CWP, AHI 1.7  per hour. A small Mirage Quattro mask was used with heated humidifier.   OXYGEN DATA:  Snoring was prevented and oxygen saturation held at a mean  of 96.7% on CPAP.   CARDIAC DATA:  Normal sinus rhythm.   MOVEMENT/PARASOMNIA:  No significant movement disturbance recorded.   IMPRESSION/RECOMMENDATIONS:  1. Successful CPAP titration to 10 CWP, AHI 1.7 per hour. A small      Mirage Quattro mask was used with heated humidifier.  2. Diagnostic NP SG on December 20, 2004 had recorded an AHI of 18.9 per      hour.      Clinton D. Maple Hudson, MD, [facility], FACP  Diplomate, Biomedical engineer of Sleep Medicine  Electronically Signed     CDY/MEDQ  D:  05/12/2007 14:58:02  T:  05/12/2007 20:05:04  Job:  [id number]

## 2010-11-01 NOTE — Group Therapy Note (Signed)
NAME:  Debra [name], Debra [name]             ACCOUNT NO.:  [id number]   MEDICAL RECORD NO.:  [id number]          PATIENT TYPE:  WOC   LOCATION:  [facility]                   FACILITY:  [facility]   PHYSICIAN:  Argentina Donovan, MD        DATE OF BIRTH:  04/02/1966   DATE OF SERVICE:  03/20/2007                                  CLINIC NOTE   The patient is a 45 year old African American nulligravida who was sent  in by [facility] because of a finding during the last Pap smear she had  a few weeks ago that she had some large fibroid tumors.  In talking to  her, her periods are somewhat heavy, she passes clots, but they have  always been that way.  She complains of right lower quadrant pain, which  sometimes makes it difficult for her to void.  The ultrasound showed at  least two large uterine masses consistent with fibroids.  Ovaries could  not be visualized, and the uterus was about 13 x 12 cm in size.   THE PATIENT'S MEDICAL PROBLEMS:  1. She is on atenolol for hypertension and today had a blood pressure      of 143/98 with a large cuff.  2. She also is on Protonix for GERD.  3. She is 5 feet 8 inches and weighs 231 pounds.  4. 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  5. Smokes half a pack of cigarettes per day.   I have discussed the fibroids with the patient.  I told her that if she  wants surgery on them because of the pain, we will go ahead and do that;  however, I want her to get the goiter and the thyroid problems settled  first, that is more important and more risky to her health.  She seems  to understand that quite well and is going to return to [place] after she has  her consultation with her doctor next week about her thyroid and what  treatment is going to be necessary for that.  She also has a sister who  has already had a thyroidectomy.   IMPRESSION:  Mildly symptomatic leiomyomata uteri.   PLAN:  Eventual total abdominal hysterectomy.     ______________________________  Argentina Donovan, MD     PR/MEDQ  D:  03/20/2007  T:  03/21/2007  Job:  [id number]

## 2010-11-04 NOTE — Procedures (Signed)
NAME:  Debra [name], Debra [name]             ACCOUNT NO.:  [id number]   MEDICAL RECORD NO.:  [id number]          PATIENT TYPE:  OUT   LOCATION:  SLEEP CENTER                 [facility]ACILITY:  [facility]   PHYSICIAN:  Clinton D. Maple Hudson, M.D. DATE OF BIRTH:  [birth year]   DATE OF STUDY:  12/20/2004                              NOCTURNAL POLYSOMNOGRAM   REFERRING PHYSICIAN:  Dr. Margarito Liner   DATE OF STUDY:  December 20, 2004   INDICATION FOR STUDY:  Hypersomnia with sleep apnea.  Epworth Sleepiness  Score 14/24, BMI 33, weight 228 pounds.   SLEEP ARCHITECTURE:  Total sleep time 343 minutes with sleep efficiency 85%.  Stage I 8%, stage II 59%, stages III and IV 16%, REM 16% of total sleep  time.  Sleep latency 14 minutes, REM latency 153 minutes, awake after sleep  onset 45 minutes, arousal index 25.  Amitriptyline was taken at bedtime.   RESPIRATORY DATA:  Respiratory disturbance index (RDI, AHI) 18.9 obstructive  events per hour indicating mild to moderate obstructive sleep apnea/hypopnea  syndrome.  There were 39 obstructive apneas, 1 mixed apnea and 68 hypopneas.  Events were recorded supine and in the left decubitus position which was her  primary sleep position.  REM RDI was increased at 85, during the 16% of time  that she was in REM.  Split protocol CPAP titration could not be utilized  because significant events developed too late, around 2 a.m., allowing  insufficient time for titration.   OXYGEN DATA:  Very loud snoring with oxygen desaturation to a nadir of 77%.  Mean oxygen saturation through the study was 96% on room air.   CARDIAC DATA:  Normal sinus rhythm and sinus tachycardia 101-107 per minute.   MOVEMENT/PARASOMNIA:  A total of 42 limb jerks were recorded of which 11  were associated with arousal or awakening for a periodic limb movement with  arousal index of 1.9 per hour which is increased.   IMPRESSION/RECOMMENDATION:  1.  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  2.  Consider return for continuous positive airway pressure titration or      evaluate for alternative therapies as appropriate.  3.  Events were not related to supine position but were clearly increased      during rapid eye movement.  4.  The patient had taken amitriptyline at bedtime.  This may increase      frequency of leg jerks during sleep.      Clinton D. Maple Hudson, M.D.  Diplomat    CDY/MEDQ  D:  01/01/2005 11:29:26  T:  01/01/2005 16:10:96  Job:  [id number]

## 2011-04-03 LAB — CBC
Platelets: 294
RDW: 12.9
WBC: 4.6

## 2011-04-03 LAB — URINALYSIS, ROUTINE W REFLEX MICROSCOPIC
Bilirubin Urine: NEGATIVE
Nitrite: NEGATIVE
Protein, ur: NEGATIVE
Specific Gravity, Urine: 1.021
Urobilinogen, UA: 1

## 2011-04-03 LAB — PREGNANCY, URINE: Preg Test, Ur: NEGATIVE

## 2011-04-03 LAB — URINE MICROSCOPIC-ADD ON

## 2011-04-03 LAB — BASIC METABOLIC PANEL
BUN: 12
Creatinine, Ser: 0.79
GFR calc non Af Amer: >60

## 2011-12-14 ENCOUNTER — Encounter (HOSPITAL_COMMUNITY): Payer: Self-pay | Admitting: Emergency Medicine

## 2011-12-14 DIAGNOSIS — D259 Leiomyoma of uterus, unspecified: Secondary | ICD-10-CM | POA: Insufficient documentation

## 2011-12-14 DIAGNOSIS — R109 Unspecified abdominal pain: Secondary | ICD-10-CM | POA: Insufficient documentation

## 2011-12-14 LAB — URINALYSIS, ROUTINE W REFLEX MICROSCOPIC
Bilirubin Urine: NEGATIVE
Glucose, UA: NEGATIVE mg/dL
Specific Gravity, Urine: 1.024 (ref 1.005–1.030)
Urobilinogen, UA: 1 mg/dL (ref 0.0–1.0)

## 2011-12-14 LAB — URINE MICROSCOPIC-ADD ON

## 2011-12-14 LAB — POCT PREGNANCY, URINE: Preg Test, Ur: NEGATIVE

## 2011-12-14 NOTE — ED Notes (Signed)
C/o RLQ and R flank pain x 1 month.  States symptoms worse over past couple of days.  LMP June 10th.  Last BM 5 days ago.  Reports R flank pain worse when she urinates.

## 2011-12-15 ENCOUNTER — Encounter (HOSPITAL_COMMUNITY): Payer: Self-pay | Admitting: Radiology

## 2011-12-15 ENCOUNTER — Emergency Department (HOSPITAL_COMMUNITY): Payer: Self-pay

## 2011-12-15 ENCOUNTER — Emergency Department (HOSPITAL_COMMUNITY)
Admission: EM | Admit: 2011-12-15 | Discharge: 2011-12-15 | Disposition: A | Discharge status: Home / Self Care | Payer: Self-pay | Attending: Emergency Medicine | Admitting: Emergency Medicine

## 2011-12-15 DIAGNOSIS — D259 Leiomyoma of uterus, unspecified: Secondary | ICD-10-CM

## 2011-12-15 HISTORY — DX: Gastro-esophageal reflux disease without esophagitis: K21.9

## 2011-12-15 MED ORDER — HYDROMORPHONE HCL PF 1 MG/ML IJ SOLN
1.0000 mg | Freq: Once | INTRAMUSCULAR | Status: AC
  Administered 2011-12-15: 1 mg via INTRAVENOUS
  Filled 2011-12-15: qty 1

## 2011-12-15 MED ORDER — HYDROCODONE-ACETAMINOPHEN 5-500 MG PO TABS: 1.0000 | ORAL_TABLET | Freq: Four times a day (QID) | ORAL | Status: AC | PRN

## 2011-12-15 MED ORDER — ONDANSETRON HCL 4 MG/2ML IJ SOLN
4.0000 mg | Freq: Once | INTRAMUSCULAR | Status: AC
  Administered 2011-12-15: 4 mg via INTRAVENOUS
  Filled 2011-12-15: qty 2

## 2011-12-15 NOTE — Discharge Instructions (Signed)
Fibroids You have been diagnosed as having a fibroid. Fibroids are smooth muscle lumps (tumors) which can occur any place in a woman's body. They are usually in the womb (uterus). The most common problem (symptom) of fibroids is bleeding. Over time this may cause low red blood cells (anemia). Other symptoms include feelings of pressure and pain in the pelvis. The diagnosis (learning what is wrong) of fibroids is made by physical exam. Sometimes tests such as an ultrasound are used. This is helpful when fibroids are felt around the ovaries and to look for tumors. TREATMENT   Most fibroids do not need surgical or medical treatment. Sometimes a tissue sample (biopsy) of the lining of the uterus is done to rule out cancer. If there is no cancer and only a small amount of bleeding, the problem can be watched.   Hormonal treatment can improve the problem.   When surgery is needed, it can consist of removing the fibroid. Vaginal birth may not be possible after the removal of fibroids. This depends on where they are and the extent of surgery. When pregnancy occurs with fibroids it is usually normal.   Your caregiver can help decide which treatments are best for you.  HOME CARE INSTRUCTIONS   Do not use aspirin as this may increase bleeding problems.   If your periods (menses) are heavy, record the number of pads or tampons used per month. Bring this information to your caregiver. This can help them determine the best treatment for you.  SEEK IMMEDIATE MEDICAL CARE IF:  You have pelvic pain or cramps not controlled with medications, or experience a sudden increase in pain.   You have an increase of pelvic bleeding between and during menses.   You feel lightheaded or have fainting spells.   You develop worsening belly (abdominal) pain.

## 2011-12-15 NOTE — ED Provider Notes (Signed)
History     CSN: [id number]  Arrival date & time 12/14/11  2254   First MD Initiated Contact with Patient 12/15/11 0215      Chief Complaint  Patient presents with  . Abdominal Pain    (Consider location/radiation/quality/duration/timing/severity/associated sxs/prior treatment) HPI History provided by patient. Right-sided abdominal pain the last month worse the last few days. Has history of heavy vaginal bleeding with periods. LMP otherwise on time. No pain with urination. Does hurt to walk. No history of kidney stones. No fevers or chills. No nausea vomiting or diarrhea. Moderate severity. No history of same otherwise. Has not been evaluated for this in the past. Has not taken anything for this at home. Past Medical History  Diagnosis Date  . Acid reflux     History reviewed. No pertinent past surgical history.  No family history on file.  History  Substance Use Topics  . Smoking status: Never Smoker   . Smokeless tobacco: Not on file  . Alcohol Use: No    OB History    Grav Para Term Preterm Abortions TAB SAB Ect Mult Living                  Review of Systems  Constitutional: Negative for fever and chills.  HENT: Negative for neck pain and neck stiffness.   Eyes: Negative for pain.  Respiratory: Negative for shortness of breath.   Cardiovascular: Negative for chest pain.  Gastrointestinal: Positive for abdominal pain and constipation. Negative for vomiting and diarrhea.  Genitourinary: Negative for dysuria.  Musculoskeletal: Negative for back pain.  Skin: Negative for rash.  Neurological: Negative for headaches.  All other systems reviewed and are negative.    Allergies  Review of patient's allergies indicates no known allergies.  Home Medications   Current Outpatient Rx  Name Route Sig Dispense Refill  . CALTRATE 600 PLUS-VIT D PO Oral Take 1 tablet by mouth 2 (two) times daily.    [name] GINSENG COMPLEX PO Oral Take 1 tablet by mouth 2 (two) times daily.       BP 124/85  Pulse 105  Temp 98.3 F (36.8 C) (Oral)  Resp 17  SpO2 96%  LMP 11/27/2011  Physical Exam  Constitutional: She is oriented to person, place, and time. She appears well-developed and well-nourished.  HENT:  Head: Normocephalic and atraumatic.  Eyes: Conjunctivae and EOM are normal. Pupils are equal, round, and reactive to light.  Neck: Trachea normal. Neck supple. No thyromegaly present.  Cardiovascular: Normal rate, regular rhythm, S1 normal, S2 normal and normal pulses.     No systolic murmur is present   No diastolic murmur is present  Pulses:      Radial pulses are 2+ on the right side, and 2+ on the left side.  Pulmonary/Chest: Effort normal and breath sounds normal. She has no wheezes. She has no rhonchi. She has no rales. She exhibits no tenderness.  Abdominal: Soft. Normal appearance and bowel sounds are normal. There is no CVA tenderness and negative Murphy's sign.       Tender right lower quadrant pelvis region without rebound guarding or peritonitis  Musculoskeletal:       BLE:s Calves nontender, no cords or erythema, negative Homans sign  Neurological: She is alert and oriented to person, place, and time. She has normal strength. No cranial nerve deficit or sensory deficit. GCS eye subscore is 4. GCS verbal subscore is 5. GCS motor subscore is 6.  Skin: Skin is warm and dry. No  rash noted. She is not diaphoretic.  Psychiatric: Her speech is normal.       Cooperative and appropriate    ED Course  Procedures (including critical care time)  Labs Reviewed  URINALYSIS, ROUTINE W REFLEX MICROSCOPIC - Abnormal; Notable for the following:    APPearance CLOUDY (*)     Hgb urine dipstick LARGE (*)     Protein, ur 100 (*)     All other components within normal limits  POCT PREGNANCY, URINE  URINE MICROSCOPIC-ADD ON   Ct Abdomen Pelvis Wo Contrast  12/15/2011  *RADIOLOGY REPORT*  Clinical Data: Right lower quadrant pain and right flank pain for 1 month.  CT  ABDOMEN AND PELVIS WITHOUT CONTRAST  Technique:  Multidetector CT imaging of the abdomen and pelvis was performed following the standard protocol without intravenous contrast.  Comparison: 05/10/2007  Findings: The lung bases are clear.  The kidneys appear symmetrical in size and shape.  No pyelocaliectasis or ureterectasis.  No renal, ureteral, or bladder stones.  The bladder wall is not thickened.  Small esophageal hiatal hernia.  The unenhanced appearance of the liver, spleen, pancreas, adrenal glands, abdominal aorta, and retroperitoneal lymph nodes is unremarkable.  Contracted gallbladder, likely due to nonfasting state.  The stomach and small bowel are decompressed.  Stool filled colon without distension.  No free air or free fluid in the abdomen.  Pelvis:  The uterus is markedly enlarged consistent with multiple fibroids.  The uterus measures about 16.5 by 12.7 x 11 cm.  Mild prominence of adnexal structures suggesting cysts.  These are slightly larger than on the previous study.  The appendix is normal.  No free or loculated pelvic fluid collections.  No bladder wall thickening.  No sigmoid diverticulitis.  No significant pelvic lymphadenopathy.  Degenerative changes in the spine.  IMPRESSION: No renal or ureteral stone or obstruction.  Marked enlargement of the uterus consistent with multiple large fibroids.  Probable ovarian cysts.  Original Report Authenticated By: Marlon Pel, M.D.   US Transvaginal Non-ob  12/15/2011  *RADIOLOGY REPORT*  Clinical Data: Right pelvic pain.  TRANSABDOMINAL AND TRANSVAGINAL ULTRASOUND OF PELVIS Technique:  Both transabdominal and transvaginal ultrasound examinations of the pelvis were performed. Transabdominal technique was performed for global imaging of the pelvis including uterus, ovaries, adnexal regions, and pelvic cul-de-sac.  It was necessary to proceed with endovaginal exam following the transabdominal exam to visualize the endometrium and ovaries.   Comparison:  CT 12/15/2011  Findings:  Uterus: Marked diffuse nodular enlargement of the uterus consistent with multiple fibroids.  The uterus measures 16.9 by 10.6 x 11.1 cm.  A fibroid is measured in the body of the uterus at 6.8 x 8.8 x 9 cm.  Another exophytic fibroid off of the anterior body is measured at 5 x 4.9 x 5.5 cm.  A cervical fibroid is measured at 3.5 x 3.6 x 3 cm.  Endometrium: The endometrium is not visualized.  Right ovary:  The right ovary is not visualized.  Left ovary: The left ovary is not visualized.  Other findings: No free fluid  IMPRESSION: Diffuse multinodular enlargement of the uterus consistent with multiple fibroids. Visualization of other pelvic structures is limited due to the large fibroids. The endometrium and ovaries are not visualized.  Original Report Authenticated By: Marlon Pel, M.D.   US Pelvis Complete  12/15/2011  *RADIOLOGY REPORT*  Clinical Data: Right pelvic pain.  TRANSABDOMINAL AND TRANSVAGINAL ULTRASOUND OF PELVIS Technique:  Both transabdominal and transvaginal ultrasound examinations of  the pelvis were performed. Transabdominal technique was performed for global imaging of the pelvis including uterus, ovaries, adnexal regions, and pelvic cul-de-sac.  It was necessary to proceed with endovaginal exam following the transabdominal exam to visualize the endometrium and ovaries.  Comparison:  CT 12/15/2011  Findings:  Uterus: Marked diffuse nodular enlargement of the uterus consistent with multiple fibroids.  The uterus measures 16.9 by 10.6 x 11.1 cm.  A fibroid is measured in the body of the uterus at 6.8 x 8.8 x 9 cm.  Another exophytic fibroid off of the anterior body is measured at 5 x 4.9 x 5.5 cm.  A cervical fibroid is measured at 3.5 x 3.6 x 3 cm.  Endometrium: The endometrium is not visualized.  Right ovary:  The right ovary is not visualized.  Left ovary: The left ovary is not visualized.  Other findings: No free fluid  IMPRESSION: Diffuse  multinodular enlargement of the uterus consistent with multiple fibroids. Visualization of other pelvic structures is limited due to the large fibroids. The endometrium and ovaries are not visualized.  Original Report Authenticated By: Marlon Pel, M.D.    IV Dilaudid. IV Zofran.    MDM   Abdominal/pelvic pain evaluated with CT scan and and UA as above.  For large fibroids, ultrasound obtained to further evaluate. Unable to visualize ovaries. Pain improved with Dilaudid.  Plan followup OB/GYN. Prescription for hydrocodone provided as needed. Patient agrees to all discharge and followup instructions and is stable for discharge home at this time.      Sunnie Nielsen, MD 12/15/11 [phone]

## 2011-12-25 ENCOUNTER — Ambulatory Visit: Payer: Self-pay | Admitting: Obstetrics & Gynecology

## 2012-01-17 ENCOUNTER — Ambulatory Visit: Payer: Self-pay | Admitting: Obstetrics & Gynecology

## 2012-02-08 ENCOUNTER — Ambulatory Visit: Payer: Self-pay | Admitting: Obstetrics & Gynecology

## 2012-02-28 ENCOUNTER — Ambulatory Visit: Payer: Self-pay | Admitting: Obstetrics & Gynecology

## 2012-04-19 ENCOUNTER — Ambulatory Visit: Payer: Self-pay | Admitting: Obstetrics & Gynecology

## 2012-05-08 ENCOUNTER — Ambulatory Visit: Payer: Self-pay | Admitting: Obstetrics and Gynecology

## 2012-05-24 ENCOUNTER — Ambulatory Visit: Payer: Self-pay | Admitting: Obstetrics & Gynecology

## 2012-07-10 ENCOUNTER — Encounter: Payer: Self-pay | Admitting: Obstetrics & Gynecology

## 2012-07-26 ENCOUNTER — Encounter: Payer: Self-pay | Admitting: Obstetrics & Gynecology

## 2012-07-26 ENCOUNTER — Ambulatory Visit (INDEPENDENT_AMBULATORY_CARE_PROVIDER_SITE_OTHER): Payer: Self-pay | Admitting: Obstetrics & Gynecology

## 2012-07-26 VITALS — BP 186/120 | HR 112 | Ht 69.0 in | Wt 241.0 lb

## 2012-07-26 DIAGNOSIS — R030 Elevated blood-pressure reading, without diagnosis of hypertension: Secondary | ICD-10-CM

## 2012-07-26 NOTE — Progress Notes (Signed)
Patients blood pressure elevated today. Dr. Erin Fulling was informed of BP readings and advised that patient go to be seen at [facility] ED to manage her BP. Pt also currently on her menstrual cycle and was advised that we need to see her when she is not on her cycle to do her pap smear. Pt offered that she could be seen today but she still would need to go to ER for her BP. Pt choose to reschedule todays appt and reschedule for at time she is not bleeding. Patient also stated that she will go to Emergency Room for her bp.

## 2012-07-26 NOTE — Progress Notes (Signed)
Patient ID: Debra [name], female   DOB: 1966/01/21, 47 y.o.   MRN: [id number] Pt presented for Annual on her menses.  Her BP was remarkably elevated and she was counseled to go to the ED.  She wants to go to her primary care physician instead.  She has not taken her BP meds in 3 years.  She will f/u for her menses when she is off her menses and her BP is better controlled.  clh-S

## 2012-07-26 NOTE — Patient Instructions (Signed)

## 2012-08-08 ENCOUNTER — Encounter: Payer: Self-pay | Admitting: Obstetrics & Gynecology

## 2012-09-05 ENCOUNTER — Encounter: Payer: Self-pay | Admitting: Obstetrics & Gynecology

## 2012-10-03 ENCOUNTER — Encounter: Payer: Self-pay | Admitting: Obstetrics & Gynecology

## 2012-12-04 ENCOUNTER — Encounter: Payer: Self-pay | Admitting: Obstetrics & Gynecology

## 2013-01-21 ENCOUNTER — Ambulatory Visit: Payer: Self-pay

## 2013-02-05 ENCOUNTER — Ambulatory Visit: Payer: Self-pay

## 2013-03-06 ENCOUNTER — Encounter: Payer: Self-pay | Admitting: Obstetrics & Gynecology

## 2013-04-14 ENCOUNTER — Encounter: Payer: Self-pay | Admitting: Obstetrics & Gynecology

## 2014-01-02 ENCOUNTER — Emergency Department (HOSPITAL_COMMUNITY): Payer: Self-pay

## 2014-01-02 ENCOUNTER — Inpatient Hospital Stay (HOSPITAL_COMMUNITY)
Admission: EM | Admit: 2014-01-02 | Discharge: 2014-01-06 | DRG: 917 | Disposition: A | Discharge status: Other Acute Inpatient Hospital | Payer: Self-pay | Attending: Internal Medicine | Admitting: Internal Medicine

## 2014-01-02 DIAGNOSIS — F10921 Alcohol use, unspecified with intoxication delirium: Secondary | ICD-10-CM

## 2014-01-02 DIAGNOSIS — N83209 Unspecified ovarian cyst, unspecified side: Secondary | ICD-10-CM

## 2014-01-02 DIAGNOSIS — Z833 Family history of diabetes mellitus: Secondary | ICD-10-CM

## 2014-01-02 DIAGNOSIS — E0781 Sick-euthyroid syndrome: Secondary | ICD-10-CM | POA: Diagnosis present

## 2014-01-02 DIAGNOSIS — F329 Major depressive disorder, single episode, unspecified: Secondary | ICD-10-CM | POA: Diagnosis present

## 2014-01-02 DIAGNOSIS — T391X2A Poisoning by 4-Aminophenol derivatives, intentional self-harm, initial encounter: Secondary | ICD-10-CM

## 2014-01-02 DIAGNOSIS — E876 Hypokalemia: Secondary | ICD-10-CM

## 2014-01-02 DIAGNOSIS — F1010 Alcohol abuse, uncomplicated: Secondary | ICD-10-CM | POA: Diagnosis present

## 2014-01-02 DIAGNOSIS — F32A Depression, unspecified: Secondary | ICD-10-CM

## 2014-01-02 DIAGNOSIS — T398X2A Poisoning by other nonopioid analgesics and antipyretics, not elsewhere classified, intentional self-harm, initial encounter: Secondary | ICD-10-CM

## 2014-01-02 DIAGNOSIS — R112 Nausea with vomiting, unspecified: Secondary | ICD-10-CM

## 2014-01-02 DIAGNOSIS — E042 Nontoxic multinodular goiter: Secondary | ICD-10-CM

## 2014-01-02 DIAGNOSIS — F411 Generalized anxiety disorder: Secondary | ICD-10-CM | POA: Diagnosis present

## 2014-01-02 DIAGNOSIS — L0292 Furuncle, unspecified: Secondary | ICD-10-CM

## 2014-01-02 DIAGNOSIS — F10929 Alcohol use, unspecified with intoxication, unspecified: Secondary | ICD-10-CM | POA: Diagnosis present

## 2014-01-02 DIAGNOSIS — Z6835 Body mass index (BMI) 35.0-35.9, adult: Secondary | ICD-10-CM

## 2014-01-02 DIAGNOSIS — T391X2S Poisoning by 4-Aminophenol derivatives, intentional self-harm, sequela: Secondary | ICD-10-CM

## 2014-01-02 DIAGNOSIS — E872 Acidosis, unspecified: Secondary | ICD-10-CM | POA: Diagnosis present

## 2014-01-02 DIAGNOSIS — T391X1A Poisoning by 4-Aminophenol derivatives, accidental (unintentional), initial encounter: Secondary | ICD-10-CM

## 2014-01-02 DIAGNOSIS — J9600 Acute respiratory failure, unspecified whether with hypoxia or hypercapnia: Secondary | ICD-10-CM | POA: Diagnosis present

## 2014-01-02 DIAGNOSIS — R945 Abnormal results of liver function studies: Secondary | ICD-10-CM

## 2014-01-02 DIAGNOSIS — M545 Low back pain, unspecified: Secondary | ICD-10-CM

## 2014-01-02 DIAGNOSIS — M549 Dorsalgia, unspecified: Secondary | ICD-10-CM | POA: Diagnosis present

## 2014-01-02 DIAGNOSIS — L0293 Carbuncle, unspecified: Secondary | ICD-10-CM

## 2014-01-02 DIAGNOSIS — K5900 Constipation, unspecified: Secondary | ICD-10-CM | POA: Diagnosis present

## 2014-01-02 DIAGNOSIS — B9789 Other viral agents as the cause of diseases classified elsewhere: Secondary | ICD-10-CM

## 2014-01-02 DIAGNOSIS — J9819 Other pulmonary collapse: Secondary | ICD-10-CM | POA: Diagnosis present

## 2014-01-02 DIAGNOSIS — R000 Tachycardia, unspecified: Secondary | ICD-10-CM

## 2014-01-02 DIAGNOSIS — I10 Essential (primary) hypertension: Secondary | ICD-10-CM

## 2014-01-02 DIAGNOSIS — F17200 Nicotine dependence, unspecified, uncomplicated: Secondary | ICD-10-CM | POA: Diagnosis present

## 2014-01-02 DIAGNOSIS — E669 Obesity, unspecified: Secondary | ICD-10-CM

## 2014-01-02 DIAGNOSIS — T391X4A Poisoning by 4-Aminophenol derivatives, undetermined, initial encounter: Secondary | ICD-10-CM

## 2014-01-02 DIAGNOSIS — T394X2A Poisoning by antirheumatics, not elsewhere classified, intentional self-harm, initial encounter: Secondary | ICD-10-CM | POA: Diagnosis present

## 2014-01-02 DIAGNOSIS — IMO0001 Reserved for inherently not codable concepts without codable children: Secondary | ICD-10-CM

## 2014-01-02 DIAGNOSIS — R45851 Suicidal ideations: Secondary | ICD-10-CM

## 2014-01-02 DIAGNOSIS — R4182 Altered mental status, unspecified: Secondary | ICD-10-CM | POA: Diagnosis present

## 2014-01-02 DIAGNOSIS — G4730 Sleep apnea, unspecified: Secondary | ICD-10-CM

## 2014-01-02 DIAGNOSIS — I498 Other specified cardiac arrhythmias: Secondary | ICD-10-CM | POA: Diagnosis present

## 2014-01-02 DIAGNOSIS — F10920 Alcohol use, unspecified with intoxication, uncomplicated: Secondary | ICD-10-CM

## 2014-01-02 DIAGNOSIS — Y92009 Unspecified place in unspecified non-institutional (private) residence as the place of occurrence of the external cause: Secondary | ICD-10-CM

## 2014-01-02 DIAGNOSIS — D259 Leiomyoma of uterus, unspecified: Secondary | ICD-10-CM

## 2014-01-02 DIAGNOSIS — G8929 Other chronic pain: Secondary | ICD-10-CM | POA: Diagnosis present

## 2014-01-02 DIAGNOSIS — K219 Gastro-esophageal reflux disease without esophagitis: Secondary | ICD-10-CM

## 2014-01-02 DIAGNOSIS — F3289 Other specified depressive episodes: Secondary | ICD-10-CM

## 2014-01-02 DIAGNOSIS — F22 Delusional disorders: Secondary | ICD-10-CM | POA: Diagnosis present

## 2014-01-02 DIAGNOSIS — T783XXA Angioneurotic edema, initial encounter: Secondary | ICD-10-CM | POA: Diagnosis present

## 2014-01-02 DIAGNOSIS — F319 Bipolar disorder, unspecified: Secondary | ICD-10-CM

## 2014-01-02 HISTORY — DX: Poisoning by 4-aminophenol derivatives, accidental (unintentional), initial encounter: T39.1X1A

## 2014-01-02 LAB — CBC WITH DIFFERENTIAL/PLATELET
BASOS PCT: 0 % (ref 0–1)
Basophils Absolute: 0 10*3/uL (ref 0.0–0.1)
EOS ABS: 0.5 10*3/uL (ref 0.0–0.7)
Eosinophils Relative: 6 % — ABNORMAL HIGH (ref 0–5)
HEMATOCRIT: 45.5 % (ref 36.0–46.0)
HEMOGLOBIN: 15.7 g/dL — AB (ref 12.0–15.0)
Lymphocytes Relative: 50 % — ABNORMAL HIGH (ref 12–46)
Lymphs Abs: 3.7 10*3/uL (ref 0.7–4.0)
MCH: 31.2 pg (ref 26.0–34.0)
MCHC: 34.5 g/dL (ref 30.0–36.0)
MCV: 90.5 fL (ref 78.0–100.0)
MONO ABS: 1 10*3/uL (ref 0.1–1.0)
MONOS PCT: 14 % — AB (ref 3–12)
Neutro Abs: 2.3 10*3/uL (ref 1.7–7.7)
Neutrophils Relative %: 30 % — ABNORMAL LOW (ref 43–77)
Platelets: 216 10*3/uL (ref 150–400)
RBC: 5.03 MIL/uL (ref 3.87–5.11)
RDW: 14.4 % (ref 11.5–15.5)
WBC: 7.4 10*3/uL (ref 4.0–10.5)

## 2014-01-02 LAB — RAPID URINE DRUG SCREEN, HOSP PERFORMED
Amphetamines: NOT DETECTED
Barbiturates: NOT DETECTED
Benzodiazepines: NOT DETECTED
Cocaine: NOT DETECTED
Opiates: NOT DETECTED
Tetrahydrocannabinol: NOT DETECTED

## 2014-01-02 LAB — COMPREHENSIVE METABOLIC PANEL
ALT: 34 U/L (ref 0–35)
ALT: 33 U/L (ref 0–35)
AST: 53 U/L — ABNORMAL HIGH (ref 0–37)
AST: 56 U/L — ABNORMAL HIGH (ref 0–37)
Albumin: 3.7 g/dL (ref 3.5–5.2)
Albumin: 3.3 g/dL — ABNORMAL LOW (ref 3.5–5.2)
Alkaline Phosphatase: 147 U/L — ABNORMAL HIGH (ref 39–117)
Alkaline Phosphatase: 121 U/L — ABNORMAL HIGH (ref 39–117)
Anion gap: 19 — ABNORMAL HIGH (ref 5–15)
Anion gap: 18 — ABNORMAL HIGH (ref 5–15)
BUN: 10 mg/dL (ref 6–23)
BUN: 8 mg/dL (ref 6–23)
CHLORIDE: 100 meq/L (ref 96–112)
CO2: 23 meq/L (ref 19–32)
CO2: 22 mEq/L (ref 19–32)
Calcium: 9.8 mg/dL (ref 8.4–10.5)
Calcium: 8.8 mg/dL (ref 8.4–10.5)
Chloride: 103 mEq/L (ref 96–112)
Creatinine, Ser: 0.77 mg/dL (ref 0.50–1.10)
Creatinine, Ser: 0.51 mg/dL (ref 0.50–1.10)
GFR calc Af Amer: >90 mL/min (ref >90)
GFR calc non Af Amer: >90 mL/min (ref >90)
Glucose, Bld: 113 mg/dL — ABNORMAL HIGH (ref 70–99)
Glucose, Bld: 144 mg/dL — ABNORMAL HIGH (ref 70–99)
POTASSIUM: 3.4 meq/L — AB (ref 3.7–5.3)
Potassium: 5 mEq/L (ref 3.7–5.3)
SODIUM: 142 meq/L (ref 137–147)
Sodium: 143 mEq/L (ref 137–147)
Total Bilirubin: 0.3 mg/dL (ref 0.3–1.2)
Total Bilirubin: <0.2 mg/dL — ABNORMAL LOW (ref 0.3–1.2)
Total Protein: 7.9 g/dL (ref 6.0–8.3)
Total Protein: 7.5 g/dL (ref 6.0–8.3)

## 2014-01-02 LAB — URINE MICROSCOPIC-ADD ON

## 2014-01-02 LAB — SALICYLATE LEVEL: Salicylate Lvl: <2 mg/dL — ABNORMAL LOW (ref 2.8–20.0)

## 2014-01-02 LAB — URINALYSIS, ROUTINE W REFLEX MICROSCOPIC
Bilirubin Urine: NEGATIVE
GLUCOSE, UA: NEGATIVE mg/dL
KETONES UR: NEGATIVE mg/dL
LEUKOCYTES UA: NEGATIVE
Nitrite: NEGATIVE
Protein, ur: 30 mg/dL — AB
SPECIFIC GRAVITY, URINE: 1.006 (ref 1.005–1.030)
Urobilinogen, UA: 0.2 mg/dL (ref 0.0–1.0)
pH: 6 (ref 5.0–8.0)

## 2014-01-02 LAB — ACETAMINOPHEN LEVEL
ACETAMINOPHEN (TYLENOL), SERUM: 80.5 ug/mL — AB (ref 10–30)
Acetaminophen (Tylenol), Serum: 123.5 ug/mL — ABNORMAL HIGH (ref 10–30)

## 2014-01-02 LAB — ETHANOL: ALCOHOL ETHYL (B): 327 mg/dL — AB (ref 0–11)

## 2014-01-02 LAB — MAGNESIUM: Magnesium: 1.9 mg/dL (ref 1.5–2.5)

## 2014-01-02 LAB — PROTIME-INR
INR: 0.93 (ref 0.00–1.49)
INR: 1.01 (ref 0.00–1.49)
Prothrombin Time: 12.5 seconds (ref 11.6–15.2)
Prothrombin Time: 13.3 seconds (ref 11.6–15.2)

## 2014-01-02 LAB — POC URINE PREG, ED: Preg Test, Ur: NEGATIVE

## 2014-01-02 LAB — I-STAT CG4 LACTIC ACID, ED: Lactic Acid, Venous: 4.76 mmol/L — ABNORMAL HIGH (ref 0.5–2.2)

## 2014-01-02 MED ORDER — SODIUM CHLORIDE 0.9 % IV BOLUS (SEPSIS)
1000.0000 mL | Freq: Once | INTRAVENOUS | Status: AC
  Administered 2014-01-02: 1000 mL via INTRAVENOUS

## 2014-01-02 MED ORDER — METOCLOPRAMIDE HCL 5 MG/ML IJ SOLN
10.0000 mg | Freq: Once | INTRAMUSCULAR | Status: AC
  Administered 2014-01-02: 10 mg via INTRAVENOUS
  Filled 2014-01-02: qty 2

## 2014-01-02 MED ORDER — ONDANSETRON HCL 4 MG/2ML IJ SOLN
4.0000 mg | Freq: Once | INTRAMUSCULAR | Status: DC
  Filled 2014-01-02: qty 2

## 2014-01-02 MED ORDER — DEXTROSE 5 % IV SOLN
15.0000 mg/kg/h | INTRAVENOUS | Status: DC
  Administered 2014-01-02: 15 mg/kg/h via INTRAVENOUS
  Filled 2014-01-02 (×2): qty 200

## 2014-01-02 MED ORDER — ONDANSETRON HCL 4 MG/2ML IJ SOLN
4.0000 mg | Freq: Once | INTRAMUSCULAR | Status: AC
  Administered 2014-01-02: 4 mg via INTRAVENOUS
  Filled 2014-01-02: qty 2

## 2014-01-02 MED ORDER — POTASSIUM CHLORIDE 10 MEQ/100ML IV SOLN
10.0000 meq | INTRAVENOUS | Status: AC
  Administered 2014-01-02 (×2): 10 meq via INTRAVENOUS
  Filled 2014-01-02 (×3): qty 100

## 2014-01-02 MED ORDER — ACETYLCYSTEINE LOAD VIA INFUSION: 150.0000 mg/kg | Freq: Once | INTRAVENOUS | Status: DC

## 2014-01-02 MED ORDER — ACETYLCYSTEINE LOAD VIA INFUSION
150.0000 mg/kg | Freq: Once | INTRAVENOUS | Status: AC
  Administered 2014-01-02: 16425 mg via INTRAVENOUS
  Filled 2014-01-02: qty 411

## 2014-01-02 MED ORDER — ONDANSETRON HCL 4 MG/2ML IJ SOLN
4.0000 mg | Freq: Once | INTRAMUSCULAR | Status: AC
  Administered 2014-01-02: 4 mg via INTRAVENOUS

## 2014-01-02 MED ORDER — DEXTROSE 5 % IV SOLN: 15.0000 mg/kg/h | INTRAVENOUS | Status: DC

## 2014-01-02 NOTE — ED Notes (Signed)
Sister Lorriane Shire) has taken patient belongings with her.

## 2014-01-02 NOTE — ED Notes (Signed)
Parker, L., PA given CG4 Lactic results.

## 2014-01-02 NOTE — ED Notes (Signed)
Bed: RESB Expected date:  Expected time:  Means of arrival:  Comments: OD 

## 2014-01-02 NOTE — ED Notes (Addendum)
Bair Huggar applied to pt. Per PA,Parker.

## 2014-01-02 NOTE — ED Notes (Signed)
Patient vomiting, Admiting MD notified. Orders received.

## 2014-01-02 NOTE — Progress Notes (Signed)
MEDICATION RELATED CONSULT NOTE - INITIAL   Pharmacy Consult for IV N-Acetylcysteine Indication: Tylenol overdose  No Known Allergies  Patient Measurements: Weight: 241 lb 6.5 oz (109.5 kg) Adjusted Body Weight:   Vital Signs: Temp: 96.2 F (35.7 C) (07/17 2012) Temp src: Rectal (07/17 2012) BP: 157/93 mmHg (07/17 2130) Pulse Rate: 116 (07/17 2101) Intake/Output from previous day:   Intake/Output from this shift: Total I/O In: 1000 [IV Piggyback:1000] Out: -   Labs:  Recent Labs  01/02/14 1920  WBC 7.4  HGB 15.7*  HCT 45.5  PLT 216  CREATININE 0.77  ALBUMIN 3.7  PROT 7.9  AST 53*  ALT 34  ALKPHOS 147*  BILITOT 0.3   The CrCl is unknown because both a height and weight (above a minimum accepted value) are required for this calculation.   Microbiology: No results found for this or any previous visit (from the past 720 hour(s)).  Medical History: Past Medical History  Diagnosis Date  . Acid reflux   . Hypertension    Assessment: 89 yoF with intentional ETOH and acetaminophen/benadryl overdose brought to [facility] with prolonged QTc, combative, and vomiting.  PMHx includes bipolar disorder, however unable to verify PTA medications yet.  Pharmacy consulted to dose IV N-Acetylcysteine and assist with poison control recommendations.    Initial pertinent labs: K 3.4 AST 53/ALT 34 Lactic Acid 1.16 PT/INR: WNL Salicylate lvl: WNL Alcohol lvl: 327 APAP lvl: 123.5  Plan:  Start IV NAC 150mg /kg x 1 over 1 hour, then infusion of 15mg /kg/hr.  Repeat LFTs, PT/INR, electrolytes, BUN/SCr, and repeat APAP level at 24 hours Replace potassium with 20mEq q 1 hr x 3 Check magnesium level  Ralene Bathe, PharmD, BCPS 01/02/2014, 9:55 PM  Pager: [phone]

## 2014-01-02 NOTE — ED Provider Notes (Addendum)
Medical screening examination/treatment/procedure(s) were conducted as a shared visit with non-physician practitioner(s) and myself.  I personally evaluated the patient during the encounter.   EKG Interpretation None     47yF with AMS after etoh and intentional overdose of acetaminophen/diphenhydramine (estimated15g, 750mg ). Pt vomiting on arrival and not given charcoal. Sister thinks ingestion around 1800. On exam pt is confused/combative. Had to be physically restrained for her safety and to obtain necessary testing. Tachycardic. Likely vagolytic effect of benadryl. BP fine. EKG showing sinus tachycardia. QTc is prolonged, but QRS duration is normal.  Will continue to monitor. Bicarb deferred. Repeat EKG w/o concerning changes. Acetaminophen level at 3 hours is 120s. Given how close to treatment line and unknown if pt had ingested anything prior to what sister reports, will treat with NAC. Only minimal elevation of AST at this point. INR normal.Lactic acid is elevated. IVF. Continue supportive care/monitoring in additional to NAC. Needs admit. Psych concerns can be addressed once medically clear.   EKG:  Rhythm: sinus tachycardia Vent. rate 109 BPM PR interval 146 ms QRS duration 83 ms QT/QTc 371/500 ms LVH ST segments: NS ST changes  CRITICAL CARE Performed by: Virgel Manifold  Total critical care time: 35 minutes  Critical care time was exclusive of separately billable procedures and treating other patients. Critical care was necessary to treat or prevent imminent or life-threatening deterioration. Critical care was time spent personally by me on the following activities: development of treatment plan with patient and/or surrogate as well as nursing, discussions with consultants, evaluation of patient's response to treatment, examination of patient, obtaining history from patient or surrogate, ordering and performing treatments and interventions, ordering and review of laboratory studies,  ordering and review of radiographic studies, pulse oximetry and re-evaluation of patient's condition.     Virgel Manifold, MD 01/02/14 2128

## 2014-01-02 NOTE — ED Provider Notes (Signed)
CSN: [id number]     Arrival date & time 01/02/14  1910 History   First MD Initiated Contact with Patient 01/02/14 1917     Chief Complaint  Patient presents with  . tylenol OD      (Consider location/radiation/quality/duration/timing/severity/associated sxs/prior Treatment) HPI Comments: The patient is a 48 year old female past medical history of hypertension, sleep apnea, presenting to emergency room chief complaint of approximately 30 tablets of Tylenol PM, and 1 pint of vodka at approximately 6 PM today, per patient's family. Level 5 caveat due to AMS.  The history is provided by the patient, the EMS personnel and a relative. No language interpreter was used.    Past Medical History  Diagnosis Date  . Acid reflux   . Hypertension    Past Surgical History  Procedure Laterality Date  . Tonsillectomy    . Breast surgery      cyst on right breast removed.   Family History  Problem Relation Age of Onset  . Diabetes Father   . Cancer Maternal Grandmother    History  Substance Use Topics  . Smoking status: Current Every Day Smoker -- 0.50 packs/day  . Smokeless tobacco: Never Used  . Alcohol Use: No   OB History   Grav Para Term Preterm Abortions TAB SAB Ect Mult Living   0 0 0 0 0 0 0 0 0 0     Review of Systems  Unable to perform ROS: Mental status change      Allergies  Review of patient's allergies indicates no known allergies.  Home Medications   Prior to Admission medications   Not on File   BP 130/98  Pulse 132  Resp 16  SpO2 97% Physical Exam  Nursing note and vitals reviewed. Constitutional: She appears well-developed and well-nourished. No distress.  HENT:  Head: Normocephalic and atraumatic. Head is without raccoon's eyes.  Neck: Neck supple.  Cardiovascular: Regular rhythm.  Tachycardia present.   Pulmonary/Chest: No respiratory distress. She has no decreased breath sounds. She has no wheezes. She has rhonchi in the right upper field, the  right middle field and the right lower field. She has no rales.  Abdominal: Soft. There is no tenderness.  Neurological: She is alert. GCS eye subscore is 4. GCS verbal subscore is 5. GCS motor subscore is 6.  PT appears intoxicated. Inappropriately answering questions inappropriately and incoherently. Moving all extremities.  Skin: Skin is warm and dry. She is not diaphoretic.  Psychiatric: Her speech is slurred.    ED Course  Procedures (including critical care time) Labs Review Results for orders placed during the hospital encounter of 01/02/14  CBC WITH DIFFERENTIAL      Result Value Ref Range   WBC 7.4  4.0 - 10.5 K/uL   RBC 5.03  3.87 - 5.11 MIL/uL   Hemoglobin 15.7 (*) 12.0 - 15.0 g/dL   HCT 45.5  36.0 - 46.0 %   MCV 90.5  78.0 - 100.0 fL   MCH 31.2  26.0 - 34.0 pg   MCHC 34.5  30.0 - 36.0 g/dL   RDW 14.4  11.5 - 15.5 %   Platelets 216  150 - 400 K/uL   Neutrophils Relative % 30 (*) 43 - 77 %   Neutro Abs 2.3  1.7 - 7.7 K/uL   Lymphocytes Relative 50 (*) 12 - 46 %   Lymphs Abs 3.7  0.7 - 4.0 K/uL   Monocytes Relative 14 (*) 3 - 12 %   Monocytes Absolute  1.0  0.1 - 1.0 K/uL   Eosinophils Relative 6 (*) 0 - 5 %   Eosinophils Absolute 0.5  0.0 - 0.7 K/uL   Basophils Relative 0  0 - 1 %   Basophils Absolute 0.0  0.0 - 0.1 K/uL  COMPREHENSIVE METABOLIC PANEL      Result Value Ref Range   Sodium 142  137 - 147 mEq/L   Potassium 3.4 (*) 3.7 - 5.3 mEq/L   Chloride 100  96 - 112 mEq/L   CO2 23  19 - 32 mEq/L   Glucose, Bld 113 (*) 70 - 99 mg/dL   BUN 10  6 - 23 mg/dL   Creatinine, Ser 0.77  0.50 - 1.10 mg/dL   Calcium 9.8  8.4 - 10.5 mg/dL   Total Protein 7.9  6.0 - 8.3 g/dL   Albumin 3.7  3.5 - 5.2 g/dL   AST 53 (*) 0 - 37 U/L   ALT 34  0 - 35 U/L   Alkaline Phosphatase 147 (*) 39 - 117 U/L   Total Bilirubin 0.3  0.3 - 1.2 mg/dL   GFR calc non Af Amer >90  >90 mL/min   GFR calc Af Amer >90  >90 mL/min   Anion gap 19 (*) 5 - 15  ACETAMINOPHEN LEVEL      Result Value  Ref Range   Acetaminophen (Tylenol), Serum 123.5 (*) 10 - 30 ug/mL  SALICYLATE LEVEL      Result Value Ref Range   Salicylate Lvl <7.4 (*) 2.8 - 20.0 mg/dL  URINE RAPID DRUG SCREEN (HOSP PERFORMED)      Result Value Ref Range   Opiates NONE DETECTED  NONE DETECTED   Cocaine NONE DETECTED  NONE DETECTED   Benzodiazepines NONE DETECTED  NONE DETECTED   Amphetamines NONE DETECTED  NONE DETECTED   Tetrahydrocannabinol NONE DETECTED  NONE DETECTED   Barbiturates NONE DETECTED  NONE DETECTED  ETHANOL      Result Value Ref Range   Alcohol, Ethyl (B) 327 (*) 0 - 11 mg/dL  PROTIME-INR      Result Value Ref Range   Prothrombin Time 12.5  11.6 - 15.2 seconds   INR 0.93  0.00 - 1.49  URINALYSIS, ROUTINE W REFLEX MICROSCOPIC      Result Value Ref Range   Color, Urine YELLOW  YELLOW   APPearance CLEAR  CLEAR   Specific Gravity, Urine 1.006  1.005 - 1.030   pH 6.0  5.0 - 8.0   Glucose, UA NEGATIVE  NEGATIVE mg/dL   Hgb urine dipstick TRACE (*) NEGATIVE   Bilirubin Urine NEGATIVE  NEGATIVE   Ketones, ur NEGATIVE  NEGATIVE mg/dL   Protein, ur 30 (*) NEGATIVE mg/dL   Urobilinogen, UA 0.2  0.0 - 1.0 mg/dL   Nitrite NEGATIVE  NEGATIVE   Leukocytes, UA NEGATIVE  NEGATIVE  ACETAMINOPHEN LEVEL      Result Value Ref Range   Acetaminophen (Tylenol), Serum 80.5 (*) 10 - 30 ug/mL  MAGNESIUM      Result Value Ref Range   Magnesium 1.9  1.5 - 2.5 mg/dL  PROTIME-INR      Result Value Ref Range   Prothrombin Time 13.3  11.6 - 15.2 seconds   INR 1.01  0.00 - 1.49  URINE MICROSCOPIC-ADD ON      Result Value Ref Range   Squamous Epithelial / LPF RARE  RARE   WBC, UA 0-2  <3 WBC/hpf   RBC / HPF 0-2  <3 RBC/hpf  Bacteria, UA RARE  RARE  POC URINE PREG, ED      Result Value Ref Range   Preg Test, Ur NEGATIVE  NEGATIVE  I-STAT CG4 LACTIC ACID, ED      Result Value Ref Range   Lactic Acid, Venous 4.76 (*) 0.5 - 2.2 mmol/L   Dg Chest Portable 1 View  01/02/2014   CLINICAL DATA:  Hypoxemia.  EXAM:  PORTABLE CHEST - 1 VIEW  COMPARISON:  January 02, 2007.  FINDINGS: The heart size and mediastinal contours are within normal limits. Both lungs are clear. Hypoinflation of the lungs is noted most likely due to poor inspiratory effort. No pneumothorax or pleural effusion is noted. The visualized skeletal structures are unremarkable.  IMPRESSION: No acute cardiopulmonary abnormality seen.   Electronically Signed   By: Sabino Dick M.D.   On: 01/02/2014 20:36   EKG: Rhythm: sinus tachycardia  Vent. rate 109 BPM  PR interval 146 ms  QRS duration 83 ms  QT/QTc 371/500 ms  LVH  ST segments: NS ST changes   MDM   Final diagnoses:  Tylenol overdose, undetermined intent, initial encounter  Alcohol intoxication, uncomplicated   Patient presents after ingestion of approximately 30 tablets of Tylenol PM (15 mg tylenol ingested with 750 mg of diphenhydramine) and 1 pint of vodka at approximately 1800 per patient's family. Patient appears intoxicated on exam, multiple episodes of vomiting in ED. Acetaminophen level 123.5. EtOH 327. AST slightly elevated 53, ALK PHOS 147. Plotted on Nomogram, under treatment line but will treat due to pt is only approximately 1.5 hours after ingestion.  Lactate elevated at 4.76, and rectal temperature 96.2, blood cultures and bear hugger ordered. Acetadote ordered per protocol. RN consulted Poison control, who agrees with current evaluation. Second Acetaminophen level 80.5. Discussed with Dr. Arnoldo Morale who agrees to admission.  Meds given in ED:  Medications  acetylcysteine (ACETADOTE) 40 mg/mL load via infusion 16,425 mg (16,425 mg Intravenous New Bag/Given 01/02/14 2151)    Followed by  acetylcysteine (ACETADOTE) 40,000 mg in dextrose 5 % 1,000 mL infusion (15 mg/kg/hr  109.5 kg Intravenous New Bag/Given 01/02/14 2150)  potassium chloride 10 mEq in 100 mL IVPB (not administered)  sodium chloride 0.9 % bolus 1,000 mL (0 mLs Intravenous Stopped 01/02/14 2147)  sodium  chloride 0.9 % bolus 1,000 mL (0 mLs Intravenous Stopped 01/02/14 2021)    New Prescriptions   No medications on file        Lorrine Kin, PA-C 01/03/14 0128

## 2014-01-02 NOTE — ED Notes (Signed)
Spoke to [employer], no additional recommendations at this time. They will call to check up on patient in a couple hours.

## 2014-01-02 NOTE — ED Notes (Signed)
Per patient sister at bedside, patient took @ 30 Tylenol at 1800 and also had 1 pint of Vodka this evening.

## 2014-01-02 NOTE — ED Notes (Addendum)
Per EMS-patient took 24 tylenol 500 mg about a hour ago-noncompliant with ems-BP 224/116, HR 120-refused EMS interventions

## 2014-01-03 ENCOUNTER — Other Ambulatory Visit: Payer: Self-pay

## 2014-01-03 ENCOUNTER — Encounter (HOSPITAL_COMMUNITY): Payer: Self-pay | Admitting: Emergency Medicine

## 2014-01-03 ENCOUNTER — Inpatient Hospital Stay (HOSPITAL_COMMUNITY): Payer: Self-pay

## 2014-01-03 DIAGNOSIS — I10 Essential (primary) hypertension: Secondary | ICD-10-CM

## 2014-01-03 DIAGNOSIS — T398X2A Poisoning by other nonopioid analgesics and antipyretics, not elsewhere classified, intentional self-harm, initial encounter: Secondary | ICD-10-CM

## 2014-01-03 DIAGNOSIS — F329 Major depressive disorder, single episode, unspecified: Secondary | ICD-10-CM

## 2014-01-03 DIAGNOSIS — F319 Bipolar disorder, unspecified: Secondary | ICD-10-CM

## 2014-01-03 DIAGNOSIS — R000 Tachycardia, unspecified: Secondary | ICD-10-CM

## 2014-01-03 DIAGNOSIS — E876 Hypokalemia: Secondary | ICD-10-CM

## 2014-01-03 DIAGNOSIS — T391X1A Poisoning by 4-Aminophenol derivatives, accidental (unintentional), initial encounter: Principal | ICD-10-CM

## 2014-01-03 DIAGNOSIS — F32A Depression, unspecified: Secondary | ICD-10-CM | POA: Diagnosis present

## 2014-01-03 DIAGNOSIS — T394X2A Poisoning by antirheumatics, not elsewhere classified, intentional self-harm, initial encounter: Secondary | ICD-10-CM

## 2014-01-03 DIAGNOSIS — R45851 Suicidal ideations: Secondary | ICD-10-CM

## 2014-01-03 DIAGNOSIS — R112 Nausea with vomiting, unspecified: Secondary | ICD-10-CM

## 2014-01-03 DIAGNOSIS — F10929 Alcohol use, unspecified with intoxication, unspecified: Secondary | ICD-10-CM | POA: Diagnosis present

## 2014-01-03 DIAGNOSIS — F3289 Other specified depressive episodes: Secondary | ICD-10-CM

## 2014-01-03 HISTORY — DX: Suicidal ideations: R45.851

## 2014-01-03 LAB — CBC
HCT: 42.9 % (ref 36.0–46.0)
Hemoglobin: 14.6 g/dL (ref 12.0–15.0)
MCH: 30.4 pg (ref 26.0–34.0)
MCHC: 34 g/dL (ref 30.0–36.0)
MCV: 89.2 fL (ref 78.0–100.0)
Platelets: 225 10*3/uL (ref 150–400)
RBC: 4.81 MIL/uL (ref 3.87–5.11)
RDW: 14.5 % (ref 11.5–15.5)
WBC: 5.3 10*3/uL (ref 4.0–10.5)

## 2014-01-03 LAB — HEPATIC FUNCTION PANEL
ALBUMIN: 3.1 g/dL — AB (ref 3.5–5.2)
ALK PHOS: 99 U/L (ref 39–117)
ALT: 29 U/L (ref 0–35)
AST: 39 U/L — ABNORMAL HIGH (ref 0–37)
Bilirubin, Direct: <0.2 mg/dL (ref 0.0–0.3)
TOTAL PROTEIN: 7.2 g/dL (ref 6.0–8.3)
Total Bilirubin: 0.2 mg/dL — ABNORMAL LOW (ref 0.3–1.2)

## 2014-01-03 LAB — BASIC METABOLIC PANEL
Anion gap: 20 — ABNORMAL HIGH (ref 5–15)
BUN: 6 mg/dL (ref 6–23)
CHLORIDE: 106 meq/L (ref 96–112)
CO2: 17 mEq/L — ABNORMAL LOW (ref 19–32)
Calcium: 8.5 mg/dL (ref 8.4–10.5)
Creatinine, Ser: 0.49 mg/dL — ABNORMAL LOW (ref 0.50–1.10)
GFR calc non Af Amer: >90 mL/min (ref >90)
Glucose, Bld: 145 mg/dL — ABNORMAL HIGH (ref 70–99)
POTASSIUM: 3.8 meq/L (ref 3.7–5.3)
Sodium: 143 mEq/L (ref 137–147)

## 2014-01-03 LAB — ACETAMINOPHEN LEVEL
Acetaminophen (Tylenol), Serum: 35.5 ug/mL — ABNORMAL HIGH (ref 10–30)
Acetaminophen (Tylenol), Serum: <15 ug/mL (ref 10–30)

## 2014-01-03 LAB — CK TOTAL AND CKMB (NOT AT ARMC)
CK, MB: <1 ng/mL (ref 0.3–4.0)
Total CK: 130 U/L (ref 7–177)

## 2014-01-03 LAB — MRSA PCR SCREENING: MRSA by PCR: NEGATIVE

## 2014-01-03 MED ORDER — PANTOPRAZOLE SODIUM 40 MG IV SOLR
40.0000 mg | INTRAVENOUS | Status: DC
  Administered 2014-01-03: 40 mg via INTRAVENOUS
  Filled 2014-01-03: qty 40

## 2014-01-03 MED ORDER — ATENOLOL 50 MG PO TABS
50.0000 mg | ORAL_TABLET | Freq: Two times a day (BID) | ORAL | Status: DC
  Administered 2014-01-03 – 2014-01-06 (×6): 50 mg via ORAL
  Filled 2014-01-03: qty 2
  Filled 2014-01-03 (×3): qty 1
  Filled 2014-01-03: qty 2
  Filled 2014-01-03 (×2): qty 1

## 2014-01-03 MED ORDER — METOPROLOL TARTRATE 1 MG/ML IV SOLN
5.0000 mg | Freq: Four times a day (QID) | INTRAVENOUS | Status: DC | PRN
  Administered 2014-01-03 – 2014-01-04 (×3): 5 mg via INTRAVENOUS
  Filled 2014-01-03 (×3): qty 5

## 2014-01-03 MED ORDER — VITAMIN B-1 100 MG PO TABS
100.0000 mg | ORAL_TABLET | Freq: Every day | ORAL | Status: DC
  Administered 2014-01-03 – 2014-01-06 (×4): 100 mg via ORAL
  Filled 2014-01-03 (×4): qty 1

## 2014-01-03 MED ORDER — LISINOPRIL 10 MG PO TABS
10.0000 mg | ORAL_TABLET | Freq: Every day | ORAL | Status: DC
  Administered 2014-01-03: 10 mg via ORAL
  Filled 2014-01-03: qty 1

## 2014-01-03 MED ORDER — DIPHENHYDRAMINE HCL 50 MG/ML IJ SOLN
INTRAMUSCULAR | Status: AC
  Administered 2014-01-03: 25 mg
  Filled 2014-01-03: qty 1

## 2014-01-03 MED ORDER — LORAZEPAM 2 MG/ML IJ SOLN
2.0000 mg | INTRAMUSCULAR | Status: DC | PRN
  Administered 2014-01-03 (×2): 2 mg via INTRAVENOUS
  Filled 2014-01-03 (×2): qty 1

## 2014-01-03 MED ORDER — MORPHINE SULFATE 2 MG/ML IJ SOLN
2.0000 mg | Freq: Once | INTRAMUSCULAR | Status: AC
  Administered 2014-01-03: 2 mg via INTRAVENOUS
  Filled 2014-01-03: qty 1

## 2014-01-03 MED ORDER — IBUPROFEN 400 MG PO TABS
400.0000 mg | ORAL_TABLET | Freq: Four times a day (QID) | ORAL | Status: DC | PRN
  Administered 2014-01-04 – 2014-01-05 (×2): 400 mg via ORAL
  Filled 2014-01-03 (×3): qty 1

## 2014-01-03 MED ORDER — METOPROLOL TARTRATE 1 MG/ML IV SOLN
5.0000 mg | Freq: Once | INTRAVENOUS | Status: AC
  Administered 2014-01-03: 5 mg via INTRAVENOUS
  Filled 2014-01-03: qty 5

## 2014-01-03 MED ORDER — FOLIC ACID 1 MG PO TABS
1.0000 mg | ORAL_TABLET | Freq: Every day | ORAL | Status: DC
  Administered 2014-01-04 – 2014-01-06 (×3): 1 mg via ORAL
  Filled 2014-01-03 (×3): qty 1

## 2014-01-03 MED ORDER — PANTOPRAZOLE SODIUM 40 MG PO TBEC
40.0000 mg | DELAYED_RELEASE_TABLET | Freq: Every day | ORAL | Status: DC
  Administered 2014-01-03 – 2014-01-06 (×4): 40 mg via ORAL
  Filled 2014-01-03 (×4): qty 1

## 2014-01-03 MED ORDER — SODIUM CHLORIDE 0.9 % IV SOLN
INTRAVENOUS | Status: DC
  Administered 2014-01-03: 100 mL via INTRAVENOUS
  Administered 2014-01-03: 13:00:00 via INTRAVENOUS

## 2014-01-03 MED ORDER — CLONIDINE HCL 0.2 MG PO TABS
0.2000 mg | ORAL_TABLET | Freq: Three times a day (TID) | ORAL | Status: DC
  Administered 2014-01-04 – 2014-01-06 (×8): 0.2 mg via ORAL
  Filled 2014-01-03 (×6): qty 1
  Filled 2014-01-03: qty 2
  Filled 2014-01-03 (×2): qty 1

## 2014-01-03 MED ORDER — ADULT MULTIVITAMIN W/MINERALS CH
1.0000 | ORAL_TABLET | Freq: Every day | ORAL | Status: DC
  Administered 2014-01-03 – 2014-01-06 (×4): 1 via ORAL
  Filled 2014-01-03 (×4): qty 1

## 2014-01-03 MED ORDER — CLONIDINE HCL 0.1 MG PO TABS
0.1000 mg | ORAL_TABLET | Freq: Three times a day (TID) | ORAL | Status: DC
  Administered 2014-01-03 (×2): 0.1 mg via ORAL
  Filled 2014-01-03 (×2): qty 1

## 2014-01-03 MED ORDER — METHYLPREDNISOLONE SODIUM SUCC 125 MG IJ SOLR
INTRAMUSCULAR | Status: AC
  Administered 2014-01-03: 50 mg
  Filled 2014-01-03: qty 2

## 2014-01-03 MED ORDER — ONDANSETRON HCL 4 MG/2ML IJ SOLN
4.0000 mg | Freq: Four times a day (QID) | INTRAMUSCULAR | Status: DC | PRN
  Administered 2014-01-03 (×2): 4 mg via INTRAVENOUS
  Filled 2014-01-03 (×2): qty 2

## 2014-01-03 MED ORDER — ATENOLOL 25 MG PO TABS
50.0000 mg | ORAL_TABLET | Freq: Every day | ORAL | Status: DC
  Administered 2014-01-03: 50 mg via ORAL
  Filled 2014-01-03 (×2): qty 2

## 2014-01-03 MED ORDER — ONDANSETRON HCL 4 MG PO TABS
4.0000 mg | ORAL_TABLET | Freq: Four times a day (QID) | ORAL | Status: DC | PRN
  Administered 2014-01-05: 4 mg via ORAL
  Filled 2014-01-03: qty 1

## 2014-01-03 MED ORDER — ENOXAPARIN SODIUM 60 MG/0.6ML ~~LOC~~ SOLN
50.0000 mg | SUBCUTANEOUS | Status: DC
  Administered 2014-01-03 – 2014-01-06 (×4): 50 mg via SUBCUTANEOUS
  Filled 2014-01-03 (×4): qty 0.6

## 2014-01-03 MED ORDER — CLONIDINE HCL 0.1 MG PO TABS
0.1000 mg | ORAL_TABLET | Freq: Once | ORAL | Status: AC
  Administered 2014-01-03: 0.1 mg via ORAL
  Filled 2014-01-03: qty 1

## 2014-01-03 MED ORDER — FOLIC ACID 5 MG/ML IJ SOLN
1.0000 mg | Freq: Every day | INTRAMUSCULAR | Status: DC
Start: 2014-01-03 — End: 2014-01-03
  Filled 2014-01-03: qty 0.2

## 2014-01-03 MED ORDER — DIPHENHYDRAMINE HCL 50 MG/ML IJ SOLN: 25.0000 mg | Freq: Once | INTRAMUSCULAR | Status: DC

## 2014-01-03 MED ORDER — HYDRALAZINE HCL 20 MG/ML IJ SOLN
10.0000 mg | Freq: Four times a day (QID) | INTRAMUSCULAR | Status: DC | PRN
  Administered 2014-01-03: 10 mg via INTRAVENOUS
  Filled 2014-01-03: qty 1

## 2014-01-03 MED ORDER — THIAMINE HCL 100 MG/ML IJ SOLN: 100.0000 mg | Freq: Every day | INTRAMUSCULAR | Status: DC

## 2014-01-03 MED ORDER — METHYLPREDNISOLONE SODIUM SUCC 125 MG IJ SOLR: 50.0000 mg | Freq: Once | INTRAMUSCULAR | Status: DC

## 2014-01-03 MED ORDER — PNEUMOCOCCAL VAC POLYVALENT 25 MCG/0.5ML IJ INJ
0.5000 mL | INJECTION | INTRAMUSCULAR | Status: AC
  Administered 2014-01-04: 0.5 mL via INTRAMUSCULAR
  Filled 2014-01-03 (×2): qty 0.5

## 2014-01-03 NOTE — H&P (Signed)
[facility] Admission History and Physical       Dena Esperanza [name] UEA:[id number] DOB: [birth year] DOA: 01/02/2014  Referring physician:  EDP PCP: Mack Hook, MD  Specialists:   Chief Complaint: Overdose  HPI: Debra [name] is a 48 y.o. female with a history of HTN, Depression, and Bipolar  Disorder who was brought to the ED after ingesting 25 -30 Tylenol PM Tablets and drinking 1 pint of vodka.  She reports that she was trying to kill herself  Because she is so depressed.    She reports being depressed for a long time and being on medication for depression in the past.   Her ingestion occurred at 1800, per her sister and EMS was called.     In the ED , she began to have nausea and vomiting.  Her initial tylenol level was  125, and at 3 hours was 80.   Poison control was contacted and patient was started on IV Mucomyst and the Tylenol Overdose protocol and referred for medical admission.      Review of Systems:  Constitutional: No Weight Loss, No Weight Gain, Night Sweats, Fevers, Chills, Dizziness, Fatigue, or Generalized Weakness HEENT: No Headaches, Difficulty Swallowing,Tooth/Dental Problems,Sore Throat,  No Sneezing, Rhinitis, Ear Ache, Nasal Congestion, or Post Nasal Drip,  Cardio-vascular:  No Chest pain, Orthopnea, PND, Edema in Lower Extremities, Anasarca, Dizziness, Palpitations  Resp: No Dyspnea, No DOE, No Productive Cough, No Non-Productive Cough, No Hemoptysis, No Wheezing.    GI: No Heartburn, Indigestion, Abdominal Pain, +Nausea, +Vomiting, Diarrhea, Hematemesis, Hematochezia, Melena, Change in Bowel Habits,  Loss of Appetite  GU: No Dysuria, Change in Color of Urine, No Urgency or Frequency, No Flank pain.  Musculoskeletal: No Joint Pain or Swelling, No Decreased Range of Motion, No Back Pain.  Neurologic: No Syncope, No Seizures, Muscle Weakness, Paresthesia, Vision Disturbance or Loss, No Diplopia, No Vertigo, No Difficulty  Walking,  Skin: No Rash or Lesions. Psych: No Change in Mood or Affect, +Depression or Anxiety, No Memory loss, No Confusion, or Hallucinations   Past Medical History  Diagnosis Date  . Acid reflux   . Hypertension        Bipolar      Depression   Past Surgical History  Procedure Laterality Date  . Tonsillectomy    . Breast surgery      cyst on right breast removed.     Prior to Admission medications   Medication Sig Start Date End Date Taking? Authorizing Provider  diphenhydramine-acetaminophen (TYLENOL PM) 25-500 MG TABS Take 1 tablet by mouth at bedtime as needed (pain and sleep).   Yes Historical Provider, MD    No Known Allergies   Social History:  reports that she has been smoking.  She has never used smokeless tobacco. She reports that she does not drink alcohol or use illicit drugs.     Family History  Problem Relation Age of Onset  . Diabetes Father   . Cancer Maternal Grandmother       Physical Exam:  GEN:  Obtunded, Tearful, Agitated, Obese 48 y.o. African American female examined  and in no acute distress; cooperative with exam Filed Vitals:   01/02/14 2352 01/02/14 2353 01/03/14 0000 01/03/14 0228  BP:   189/97 210/136  Pulse: 112 147 116 119  Temp:  98.2 F (36.8 C)  99.1 F (37.3 C)  TempSrc:  Other (Comment)  Other (Comment)  Resp: 21 18 22 13   Height:    5\' 8"  (1.727 m)  Weight:  105.5 kg (232 lb 9.4 oz)  SpO2: 100% 100% 92% 100%   Blood pressure 210/136, pulse 119, temperature 99.1 F (37.3 C), temperature source Other (Comment), resp. rate 13, height 5\' 8"  (1.727 m), weight 105.5 kg (232 lb 9.4 oz), SpO2 100.00%. PSYCH: She is alert and oriented x4; does appear depressed;  HEENT: Normocephalic and Atraumatic, Mucous membranes pink; PERRLA; EOM intact; Fundi:  Benign;  No scleral icterus, Nares: Patent, Oropharynx: Clear, Fair Dentition,    Neck:  FROM, No Cervical Lymphadenopathy nor Thyromegaly or Carotid Bruit; No JVD; Breasts:: Not  examined CHEST WALL: No tenderness CHEST: Normal respiration, clear to auscultation bilaterally HEART: Regular rate and rhythm; no murmurs rubs or gallops BACK: No kyphosis or scoliosis; No CVA tenderness ABDOMEN: Positive Bowel Sounds, Obese, Soft Non-Tender; No Masses, No Organomegaly, No Pannus; No Intertriginous candida. Rectal Exam: Not done EXTREMITIES: No Cyanosis, Clubbing, or Edema; No Ulcerations. Genitalia: not examined PULSES: 2+ and symmetric SKIN: Normal hydration no rash or ulceration CNS:  Obtunded, Depressed and Crying, Speech Slurred, Knows where she is,  Has appropriate answers to questions,    Moving all 4 Exts, and No Focal Deficits.   Vascular: pulses palpable throughout    Labs on Admission: Basic Metabolic Panel:  Recent Labs Lab 01/02/14 1920 01/02/14 1935 01/02/14 2217  NA 142  --  143  K 3.4*  --  5.0  CL 100  --  103  CO2 23  --  22  GLUCOSE 113*  --  144*  BUN 10  --  8  CREATININE 0.77  --  0.51  CALCIUM 9.8  --  8.8  MG  --  1.9  --    Liver Function Tests:  Recent Labs Lab 01/02/14 1920 01/02/14 2217  AST 53* 56*  ALT 34 33  ALKPHOS 147* 121*  BILITOT 0.3 <0.2*  PROT 7.9 7.5  ALBUMIN 3.7 3.3*   No results found for this basename: LIPASE, AMYLASE,  in the last 168 hours No results found for this basename: AMMONIA,  in the last 168 hours CBC:  Recent Labs Lab 01/02/14 1920  WBC 7.4  NEUTROABS 2.3  HGB 15.7*  HCT 45.5  MCV 90.5  PLT 216   Cardiac Enzymes:  Recent Labs Lab 01/02/14 2348  CKTOTAL 130  CKMB <1.0    BNP (last 3 results) No results found for this basename: PROBNP,  in the last 8760 hours CBG: No results found for this basename: GLUCAP,  in the last 168 hours  Radiological Exams on Admission: Dg Chest Portable 1 View  01/02/2014   CLINICAL DATA:  Hypoxemia.  EXAM: PORTABLE CHEST - 1 VIEW  COMPARISON:  January 02, 2007.  FINDINGS: The heart size and mediastinal contours are within normal limits. Both  lungs are clear. Hypoinflation of the lungs is noted most likely due to poor inspiratory effort. No pneumothorax or pleural effusion is noted. The visualized skeletal structures are unremarkable.  IMPRESSION: No acute cardiopulmonary abnormality seen.   Electronically Signed   By: Sabino Dick M.D.   On: 01/02/2014 20:36     EKG: Independently reviewed.  Sinus Tachycardia at 109   Assessment/Plan:   48 y.o. female with   Principal Problem: 1.   Tylenol overdose:     Tylenol  Overdose Protocol, Administering IV Mucomyst, and Monitoring Tylenol levels.      Cardiac and Neurologic Monitoring, NPO for now.    Active Problems: 2.   Suicidal ideation:     Suicide Precautions,  1:1 sitter for Patient Safety,  Psych consult and Evaluation    when medically clear.     3.   Alcohol intoxication:     Unclear as to whether this is a one-time occurrence or a history of ETOH Use/Abuse   Placed on the ETOH withdrawal CIWA Protocol and monitor for S/Sxs of withdrawal    4.   Unspecified essential hypertension:     PRN IV Hydralazine, Monitor BPs.      5.   Nausea and vomiting:     Anti-emetics PRN.     6.   Depression:     Psych Evaluation when alert and Medically clear.     7.   Hypokalemia:     Replete K+.     8.   DVT Prophylaxis:     SCDs.         Code Status:    FULL CODE   Family Communication:    No Family Present Disposition Plan:       Inpatient  Time spent:  [place] [facility] Pager [phone]    If [place] Please Contact the Day Rounding Team MD for [facility]  If 7PM-7AM, Please Contact night-coverage  [email or web address] Password [email or web address] 01/03/2014, 3:07 AM

## 2014-01-03 NOTE — ED Notes (Signed)
Repeat EKG given to Hospitalist, Harvette,MD., For review.

## 2014-01-03 NOTE — ED Notes (Signed)
Called report to nurse. Nurse not available for report.

## 2014-01-03 NOTE — Progress Notes (Addendum)
Paged MD on call. Pt tongue is visibly slightly swollen from shift change. Pt also reports jaw is swollen R>L.   Pt is on Lisinopril.   Awaiting Orders.    Given verbal orders for Solumedrol 50mg  and Benedryl 25 mg by IV.

## 2014-01-03 NOTE — Progress Notes (Addendum)
[facility] PROGRESS NOTE  Debra [name] BHA:[id number] DOB: 1966-02-18 DOA: 01/02/2014 PCP: Mack Hook, MD  Assessment/Plan  Tylenol overdose, initial level 125 at 8h, above treatment threshold.  Recent level < 15 -  Continue N-acetylcysteine treatment as previously prescribed -  Repeat LFTs and INR -  Continue telemetry  EtOH intoxication and withdrawal, becoming increasingly hypertensive and tachycardic -  Continue CIWA protocol -  Continue thiamine, folate, and MVI (change to PO)  Malignant hypertension, has not been able to get her blood pressure medications in several months -  Resume atenolol  -  Hold on HCTZ while hydrating to protect liver -  Start ACEI -  Continue prn metoprolol  Sinus tachycardia, likely due to withdrawal.  No dyspnea to suggest PE -  Resume BB -  Continue prn metoprolol for persistent tachycardia -  D/c prn hydralazine which is likely worsening tachycardia -  TSH  Acute hypoxic respiratory failure likely secondary to atelectasis -  OOB -  Wean patient to room air as tolerated  Nausea and vomiting due to overdose, resolving -  Continue prn zofran -  Start healthy heart diet  Depression and anxiety -  Psychiatry consultation  Positive anion gap metabolic acidosis due to drug overdose, N-Ac treatment -  Continue IVF  Midline lower abdominal mass, likely fibroids which were seen on CT and [place] several years ago -  Pt already has GYN follow up arranged  Diet:  Healthy heart Access:  PIV IVF:  yes Proph:  lovenox  Code Status: full Family Communication: patient alone Disposition Plan: pending improvement in HR and BP will transfer to telemetry.  At risk for EtOH withdrawal   Consultants:  Poison Control  Procedures:  CXR  Antibiotics:  None   HPI/Subjective:  Denies HA, fevers, chills.  Had vomiting overnight, but feels better and hungry this morning.  Denies constipation and diarrhea, but c/o  midline lower abdominal mass.  Denies cough, SOB.      Objective: Filed Vitals:   01/03/14 0321 01/03/14 0330 01/03/14 0500 01/03/14 0515  BP: 208/89 205/87 221/105 212/75  Pulse: 116 123 126 135  Temp: 99.1 F (37.3 C) 99.1 F (37.3 C) 98.6 F (37 C) 98.8 F (37.1 C)  TempSrc: Core (Comment) Core (Comment) Core (Comment) Core (Comment)  Resp: 17 30 18 19   Height:      Weight:      SpO2: 98% 97% 96% 94%    Intake/Output Summary (Last 24 hours) at 01/03/14 0724 Last data filed at 01/03/14 0538  Gross per 24 hour  Intake 2010.6 ml  Output   2750 ml  Net -739.4 ml   Filed Weights   01/02/14 2100 01/03/14 0228  Weight: 109.501 kg (241 lb 6.5 oz) 105.5 kg (232 lb 9.4 oz)    Exam:   General:  BF, No acute distress  HEENT:  NCAT, MMM  Cardiovascular:  Tachycardic RR, nl S1, S2 no mrg, 2+ pulses, warm extremities  Respiratory:  CTAB, no increased WOB  Abdomen:   NABS, soft, NT/ND, Firm midline lower abdominal mass which feels like uterus close to [redacted] weeks GA, nontender  MSK:   Normal tone and bulk, no LEE  Neuro:  Grossly intact  Data Reviewed: Basic Metabolic Panel:  Recent Labs Lab 01/02/14 1920 01/02/14 1935 01/02/14 2217 01/03/14 0405  NA 142  --  143 143  K 3.4*  --  5.0 3.8  CL 100  --  103 106  CO2 23  --  22  17*  GLUCOSE 113*  --  144* 145*  BUN 10  --  8 6  CREATININE 0.77  --  0.51 0.49*  CALCIUM 9.8  --  8.8 8.5  MG  --  1.9  --   --    Liver Function Tests:  Recent Labs Lab 01/02/14 1920 01/02/14 2217  AST 53* 56*  ALT 34 33  ALKPHOS 147* 121*  BILITOT 0.3 <0.2*  PROT 7.9 7.5  ALBUMIN 3.7 3.3*   No results found for this basename: LIPASE, AMYLASE,  in the last 168 hours No results found for this basename: AMMONIA,  in the last 168 hours CBC:  Recent Labs Lab 01/02/14 1920 01/03/14 0405  WBC 7.4 5.3  NEUTROABS 2.3  --   HGB 15.7* 14.6  HCT 45.5 42.9  MCV 90.5 89.2  PLT 216 225   Cardiac Enzymes:  Recent Labs Lab  01/02/14 2348  CKTOTAL 130  CKMB <1.0   BNP (last 3 results) No results found for this basename: PROBNP,  in the last 8760 hours CBG: No results found for this basename: GLUCAP,  in the last 168 hours  Recent Results (from the past 240 hour(s))  MRSA PCR SCREENING     Status: None   Collection Time    01/03/14  2:31 AM      Result Value Ref Range Status   MRSA by PCR NEGATIVE  NEGATIVE Final   Comment:            The GeneXpert MRSA Assay (FDA     approved for NASAL specimens     only), is one component of a     comprehensive MRSA colonization     surveillance program. It is not     intended to diagnose MRSA     infection nor to guide or     monitor treatment for     MRSA infections.     Studies: Dg Chest Portable 1 View  01/02/2014   CLINICAL DATA:  Hypoxemia.  EXAM: PORTABLE CHEST - 1 VIEW  COMPARISON:  January 02, 2007.  FINDINGS: The heart size and mediastinal contours are within normal limits. Both lungs are clear. Hypoinflation of the lungs is noted most likely due to poor inspiratory effort. No pneumothorax or pleural effusion is noted. The visualized skeletal structures are unremarkable.  IMPRESSION: No acute cardiopulmonary abnormality seen.   Electronically Signed   By: Sabino Dick M.D.   On: 01/02/2014 20:36    Scheduled Meds: . folic acid  1 mg Intravenous Daily  . multivitamin with minerals  1 tablet Oral Daily  . ondansetron (ZOFRAN) IV  4 mg Intravenous Once  . pantoprazole (PROTONIX) IV  40 mg Intravenous Q24H  . thiamine  100 mg Intravenous Daily   Continuous Infusions: . sodium chloride 100 mL (01/03/14 0309)  . acetylcysteine 15 mg/kg/hr (01/03/14 0500)    Principal Problem:   Tylenol overdose Active Problems:   Suicidal ideation   Nausea and vomiting   Alcohol intoxication   Depression   Unspecified essential hypertension   Hypokalemia    Time spent: 30 min    Peggy Monk, [facility] Pager [phone]. If 7PM-7AM, please  contact night-coverage at [email or web address], password [facility] 01/03/2014, 7:24 AM  LOS: 1 day
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
## 2014-01-04 DIAGNOSIS — F1010 Alcohol abuse, uncomplicated: Secondary | ICD-10-CM

## 2014-01-04 DIAGNOSIS — D259 Leiomyoma of uterus, unspecified: Secondary | ICD-10-CM

## 2014-01-04 DIAGNOSIS — I498 Other specified cardiac arrhythmias: Secondary | ICD-10-CM

## 2014-01-04 LAB — CBC
HCT: 41.1 % (ref 36.0–46.0)
Hemoglobin: 13.8 g/dL (ref 12.0–15.0)
MCH: 30.1 pg (ref 26.0–34.0)
MCHC: 33.6 g/dL (ref 30.0–36.0)
MCV: 89.5 fL (ref 78.0–100.0)
Platelets: 183 10*3/uL (ref 150–400)
RBC: 4.59 MIL/uL (ref 3.87–5.11)
RDW: 14.3 % (ref 11.5–15.5)
WBC: 3.5 10*3/uL — ABNORMAL LOW (ref 4.0–10.5)

## 2014-01-04 LAB — COMPREHENSIVE METABOLIC PANEL
ALT: 21 U/L (ref 0–35)
ANION GAP: 12 (ref 5–15)
AST: 20 U/L (ref 0–37)
Albumin: 3.2 g/dL — ABNORMAL LOW (ref 3.5–5.2)
Alkaline Phosphatase: 104 U/L (ref 39–117)
BUN: 9 mg/dL (ref 6–23)
CO2: 24 mEq/L (ref 19–32)
CREATININE: 0.64 mg/dL (ref 0.50–1.10)
Calcium: 9.6 mg/dL (ref 8.4–10.5)
Chloride: 103 mEq/L (ref 96–112)
GFR calc non Af Amer: >90 mL/min (ref >90)
GLUCOSE: 135 mg/dL — AB (ref 70–99)
POTASSIUM: 4.2 meq/L (ref 3.7–5.3)
Sodium: 139 mEq/L (ref 137–147)
TOTAL PROTEIN: 6.9 g/dL (ref 6.0–8.3)
Total Bilirubin: 0.4 mg/dL (ref 0.3–1.2)

## 2014-01-04 LAB — TSH: TSH: 0.007 u[IU]/mL — ABNORMAL LOW (ref 0.350–4.500)

## 2014-01-04 LAB — HIV ANTIBODY (ROUTINE TESTING W REFLEX): HIV: NONREACTIVE

## 2014-01-04 LAB — PROTIME-INR
INR: 0.97 (ref 0.00–1.49)
PROTHROMBIN TIME: 12.9 s (ref 11.6–15.2)

## 2014-01-04 LAB — RPR

## 2014-01-04 MED ORDER — ARIPIPRAZOLE 5 MG PO TABS
5.0000 mg | ORAL_TABLET | Freq: Every day | ORAL | Status: DC
  Administered 2014-01-04 – 2014-01-05 (×2): 5 mg via ORAL
  Filled 2014-01-04 (×3): qty 1

## 2014-01-04 MED ORDER — DIPHENHYDRAMINE HCL 25 MG PO CAPS
25.0000 mg | ORAL_CAPSULE | Freq: Four times a day (QID) | ORAL | Status: DC | PRN
  Administered 2014-01-04 (×2): 25 mg via ORAL
  Filled 2014-01-04 (×2): qty 1

## 2014-01-04 MED ORDER — MUSCLE RUB 10-15 % EX CREA
TOPICAL_CREAM | CUTANEOUS | Status: DC | PRN
  Administered 2014-01-04: 19:00:00 via TOPICAL
  Filled 2014-01-04: qty 85

## 2014-01-04 MED ORDER — AMLODIPINE BESYLATE 5 MG PO TABS
5.0000 mg | ORAL_TABLET | Freq: Every day | ORAL | Status: DC
  Administered 2014-01-04 – 2014-01-06 (×3): 5 mg via ORAL
  Filled 2014-01-04 (×3): qty 1

## 2014-01-04 MED ORDER — PREDNISONE 20 MG PO TABS
40.0000 mg | ORAL_TABLET | Freq: Every day | ORAL | Status: DC
  Administered 2014-01-04 – 2014-01-05 (×2): 40 mg via ORAL
  Filled 2014-01-04 (×3): qty 2

## 2014-01-04 MED ORDER — HYDROCHLOROTHIAZIDE 25 MG PO TABS
25.0000 mg | ORAL_TABLET | Freq: Every day | ORAL | Status: DC
  Administered 2014-01-04 – 2014-01-06 (×3): 25 mg via ORAL
  Filled 2014-01-04 (×3): qty 1

## 2014-01-04 MED ORDER — FLUOXETINE HCL 20 MG PO CAPS
20.0000 mg | ORAL_CAPSULE | Freq: Every day | ORAL | Status: DC
  Administered 2014-01-04 – 2014-01-06 (×3): 20 mg via ORAL
  Filled 2014-01-04 (×3): qty 1

## 2014-01-04 MED ORDER — METHOCARBAMOL 500 MG PO TABS
500.0000 mg | ORAL_TABLET | Freq: Three times a day (TID) | ORAL | Status: DC | PRN
  Administered 2014-01-04 – 2014-01-06 (×4): 500 mg via ORAL
  Filled 2014-01-04 (×4): qty 1

## 2014-01-04 NOTE — Progress Notes (Signed)
Dr. Sheran Fava placed new orders Epic for back pain relief . Will continue to monitor.
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
## 2014-01-04 NOTE — Progress Notes (Signed)
[facility] PROGRESS NOTE  Debra [name] GUY:[id number] DOB: 04-Oct-1965 DOA: 01/02/2014 PCP: Mack Hook, MD  Assessment/Plan  Tylenol overdose, initial level 125 at 8h, above treatment threshold.  Recent level < 15 -  N-acetylcysteine treatment completed yesterday -  Repeat LFTs and INR trending down/normal  EtOH intoxication and withdrawal, hypertensive and tachycardic but minimal other symptoms of withdrawal this morning.   -  Continue CIWA protocol -  Continue thiamine, folate, and MVI  Malignant hypertension, has not been able to get her blood pressure medications in several months -  Continue atenolol  -  Titrate clonidine as needed -  Start HCTZ and norvasc -  D/c ACEI -  Continue prn metoprolol  Angioedema from ACEI, resolving -  D/c ACEI -  Given solumedrol and benadral overnight -  Start Tanishia Lemaster prednisone taper  Sinus tachycardia, likely due to withdrawal.  No dyspnea to suggest PE -  Continue BB -  Continue prn metoprolol for persistent tachycardia -  TSH pending  Acute hypoxic respiratory failure likely secondary to atelectasis -  OOB -  Wean patient to room air as tolerated  Nausea and vomiting due to overdose, resolving -  Continue prn zofran -  Start healthy heart diet  Depression and anxiety, appreciate psychiatry assistance  Positive anion gap metabolic acidosis due to drug overdose, N-Ac treatment, resolved with IVF  Midline lower abdominal mass, likely fibroids which were seen on CT and [place] several years ago -  Pt already has GYN follow up arranged  Chronic back pain, start robaxin prn  Diet:  Healthy heart Access:  PIV IVF:  yes Proph:  lovenox  Code Status: full Family Communication: patient alone Disposition Plan:  Transfer to telemetry.  Will likely need inpatient psych transfer.     Consultants:  Poison Control  Procedures:  CXR  Antibiotics:  None   HPI/Subjective:  Had jaw and tongue swelling  last night.  Given steroids and benadryl.  Has chronic back pain and asking for muscle relaxant  Objective: Filed Vitals:   01/04/14 0600 01/04/14 0617 01/04/14 0624 01/04/14 0757  BP: 175/88     Pulse: 80 81 84   Temp:  98.4 F (36.9 C)  98.7 F (37.1 C)  TempSrc:  Oral  Oral  Resp: 14 16 18    Height:      Weight:   102.8 kg (226 lb 10.1 oz)   SpO2: 93% 93% 95%     Intake/Output Summary (Last 24 hours) at 01/04/14 0759 Last data filed at 01/04/14 0600  Gross per 24 hour  Intake 2943.03 ml  Output    845 ml  Net 2098.03 ml   Filed Weights   01/02/14 2100 01/03/14 0228 01/04/14 0624  Weight: 109.501 kg (241 lb 6.5 oz) 105.5 kg (232 lb 9.4 oz) 102.8 kg (226 lb 10.1 oz)    Exam:   General:  BF, No acute distress  HEENT:  NCAT, MMM, no tongue fasciculations or swelling.  Jaw back to normal size  Cardiovascular:  RRR, nl S1, S2 no mrg, 2+ pulses, warm extremities  Respiratory:  CTAB, no increased WOB  Abdomen:   NABS, soft, NT/ND, Firm midline lower abdominal mass which feels like uterus close to [redacted] weeks GA, nontender  MSK:   Normal tone and bulk, no LEE  Neuro:  Grossly intact  Data Reviewed: Basic Metabolic Panel:  Recent Labs Lab 01/02/14 1920 01/02/14 1935 01/02/14 2217 01/03/14 0405 01/04/14 0357  NA 142  --  143 143 139  K 3.4*  --  5.0 3.8 4.2  CL 100  --  103 106 103  CO2 23  --  22 17* 24  GLUCOSE 113*  --  144* 145* 135*  BUN 10  --  8 6 9   CREATININE 0.77  --  0.51 0.49* 0.64  CALCIUM 9.8  --  8.8 8.5 9.6  MG  --  1.9  --   --   --    Liver Function Tests:  Recent Labs Lab 01/02/14 1920 01/02/14 2217 01/03/14 0405 01/04/14 0357  AST 53* 56* 39* 20  ALT 34 33 29 21  ALKPHOS 147* 121* 99 104  BILITOT 0.3 <0.2* 0.2* 0.4  PROT 7.9 7.5 7.2 6.9  ALBUMIN 3.7 3.3* 3.1* 3.2*   No results found for this basename: LIPASE, AMYLASE,  in the last 168 hours No results found for this basename: AMMONIA,  in the last 168 hours CBC:  Recent  Labs Lab 01/02/14 1920 01/03/14 0405 01/04/14 0357  WBC 7.4 5.3 3.5*  NEUTROABS 2.3  --   --   HGB 15.7* 14.6 13.8  HCT 45.5 42.9 41.1  MCV 90.5 89.2 89.5  PLT 216 225 183   Cardiac Enzymes:  Recent Labs Lab 01/02/14 2348  CKTOTAL 130  CKMB <1.0   BNP (last 3 results) No results found for this basename: PROBNP,  in the last 8760 hours CBG: No results found for this basename: GLUCAP,  in the last 168 hours  Recent Results (from the past 240 hour(s))  MRSA PCR SCREENING     Status: None   Collection Time    01/03/14  2:31 AM      Result Value Ref Range Status   MRSA by PCR NEGATIVE  NEGATIVE Final   Comment:            The GeneXpert MRSA Assay (FDA     approved for NASAL specimens     only), is one component of a     comprehensive MRSA colonization     surveillance program. It is not     intended to diagnose MRSA     infection nor to guide or     monitor treatment for     MRSA infections.     Studies: Dg Neck Soft Tissue  01/03/2014   CLINICAL DATA:  Sudden onset of neck swelling and lower jaw swelling. Assess airway.  EXAM: NECK SOFT TISSUES - 1+ VIEW  COMPARISON:  None.  FINDINGS: There appears to be a steeple sign, suspicious for subglottic tracheal narrowing. Adult croup is a concern, though per clinical correlation the patient has no signs of infection.  The trachea is unremarkable in appearance on the lateral view. The nasopharynx, oropharynx and hypopharynx are grossly unremarkable. The epiglottis is difficult to fully assess but appears grossly normal in caliber. The prevertebral soft tissues are within normal limits. No acute osseous abnormalities are seen. The visualized lung apices are grossly clear.  IMPRESSION: Steeple side noted, suspicious for subglottic tracheal narrowing. Adult croup is a concern, though per clinical correlation the patient has no signs of infection and this may simply reflect an allergic reaction. Would follow closely for clinical signs of  airway compromise.  These results were called by telephone at the time of interpretation on 01/03/2014 at 9:32 pm to Dot Lanes Nurse at the [facility], who verbally acknowledged these results.   Electronically Signed   By: Garald Balding M.D.   On: 01/03/2014 21:35  Dg Chest Portable 1 View  01/02/2014   CLINICAL DATA:  Hypoxemia.  EXAM: PORTABLE CHEST - 1 VIEW  COMPARISON:  January 02, 2007.  FINDINGS: The heart size and mediastinal contours are within normal limits. Both lungs are clear. Hypoinflation of the lungs is noted most likely due to poor inspiratory effort. No pneumothorax or pleural effusion is noted. The visualized skeletal structures are unremarkable.  IMPRESSION: No acute cardiopulmonary abnormality seen.   Electronically Signed   By: Sabino Dick M.D.   On: 01/02/2014 20:36    Scheduled Meds: . amLODipine  5 mg Oral Daily  . atenolol  50 mg Oral BID  . cloNIDine  0.2 mg Oral TID  . diphenhydrAMINE  25 mg Intravenous Once  . enoxaparin (LOVENOX) injection  50 mg Subcutaneous Q24H  . folic acid  1 mg Oral Daily  . hydrochlorothiazide  25 mg Oral Daily  . methylPREDNISolone (SOLU-MEDROL) injection  50 mg Intravenous Once  . multivitamin with minerals  1 tablet Oral Daily  . ondansetron (ZOFRAN) IV  4 mg Intravenous Once  . pantoprazole  40 mg Oral Daily  . pneumococcal 23 valent vaccine  0.5 mL Intramuscular Tomorrow-1000  . predniSONE  40 mg Oral Daily  . thiamine  100 mg Oral Daily   Continuous Infusions:    Principal Problem:   Tylenol overdose Active Problems:   Suicidal ideation   Nausea and vomiting   Alcohol intoxication   Depression   Unspecified essential hypertension   Hypokalemia   Malignant hypertension   Sinus tachycardia    Time spent: 30 min    Alfreda Hammad, [place] [facility] Pager [phone]. If 7PM-7AM, please contact night-coverage at [email or web address], password [facility] 01/04/2014, 7:59 AM  LOS: 2 days

## 2014-01-04 NOTE — ED Provider Notes (Signed)
Medical screening examination/treatment/procedure(s) were conducted as a shared visit with non-physician practitioner(s) and myself.  I personally evaluated the patient during the encounter.   EKG Interpretation   Date/Time:  Friday January 02 2014 21:19:40 EDT Ventricular Rate:  109 PR Interval:  142 QRS Duration: 84 QT Interval:  360 QTC Calculation: 485 R Axis:   25 Text Interpretation:  Sinus tachycardia Probable left atrial enlargement  Minimal ST depression, inferior leads ST elev, probable normal early repol  pattern ED PHYSICIAN INTERPRETATION AVAILABLE IN [facility] Confirmed  by TEST, Record ([id number]) on 01/04/2014 8:47:50 AM     See other note.   Virgel Manifold, MD 01/04/14 [phone]

## 2014-01-04 NOTE — Progress Notes (Signed)
Pt c/o chronic back pain not relived by Robaxin or Ibuprofen. Dr. Sheran Fava text paged. awaiting return call. Will continue to monitor.

## 2014-01-05 DIAGNOSIS — I10 Essential (primary) hypertension: Secondary | ICD-10-CM

## 2014-01-05 DIAGNOSIS — F10231 Alcohol dependence with withdrawal delirium: Secondary | ICD-10-CM

## 2014-01-05 DIAGNOSIS — F10931 Alcohol use, unspecified with withdrawal delirium: Secondary | ICD-10-CM

## 2014-01-05 LAB — T3, FREE: T3, Free: 3.5 pg/mL (ref 2.3–4.2)

## 2014-01-05 LAB — T4, FREE: Free T4: 1.5 ng/dL (ref 0.80–1.80)

## 2014-01-05 MED ORDER — PREDNISONE 20 MG PO TABS
20.0000 mg | ORAL_TABLET | Freq: Every day | ORAL | Status: DC
  Administered 2014-01-06: 20 mg via ORAL
  Filled 2014-01-05 (×2): qty 1

## 2014-01-05 MED ORDER — POLYETHYLENE GLYCOL 3350 17 G PO PACK
17.0000 g | PACK | Freq: Every day | ORAL | Status: DC
Start: 2014-01-05 — End: 2014-01-06
  Administered 2014-01-05: 17 g via ORAL
  Filled 2014-01-05 (×2): qty 1

## 2014-01-05 NOTE — Progress Notes (Signed)
Clinical Social Work  Per chart review, patient is medically stable to DC today. CSW contacted the following facilities re: placement:  [facility]- no available beds  [place] spoke with [facility]) who reports he will review information  [facility]- no available beds  [facility]- no available beds  [facility]- no [place] bed available beds  CSW will continue to follow.  [place], [place] [phone]

## 2014-01-05 NOTE — Progress Notes (Signed)
[facility] PROGRESS NOTE  Debra [name] HGD:[id number] DOB: Dec 08, 1965 DOA: 01/02/2014 PCP: Mack Hook, MD  Assessment/Plan  Tylenol overdose, initial level 125 at 8h, above treatment threshold.  Recent level < 15 -  N-acetylcysteine treatment completed 7/18 -  Repeat LFTs and INR trended down  Suicide attempt by drug overdose.  Depression and anxiety, appreciate psychiatry assistance -  Started abilify and prozac on 7/19 -  Anticipate transfer to inpatient psychiatry when bed available -  Medically stable for transfer  EtOH intoxication and withdrawal, CIWA scores low, no ativan in more than 24 hours -  Continue thiamine, folate, and MVI  Malignant hypertension, blood pressure markedly improved -  Continue atenolol, clonidine, HCTZ and norvasc   Angioedema from ACEI, resolved with stopping medication.  Lawan Nanez prednisone taper -  Prednisone 20mg  on 7/21, then 10mg  on 7/22, then stop  Sinus tachycardia, likely due to withdrawal.  No dyspnea to suggest PE.  Resolved. -  Continue BB and clonidine  Acute hypoxic respiratory failure likely secondary to atelectasis during period of initial lethargy and resolved.  Nausea and vomiting due to overdose, resolved.  Tolerating diet  Positive anion gap metabolic acidosis due to drug overdose, N-Ac treatment, resolved with IVF  Sick euthyroid with decreased TSH and normal fT3 and fT4, likely secondary to steroids -  Repeat TFTs in 4 weeks  Midline lower abdominal mass, likely fibroids which were seen on CT and [place] several years ago -  Pt already has GYN follow up arranged  Chronic back pain, continue robaxin prn  Diet:  Healthy heart Access:  PIV IVF:  yes Proph:  lovenox  Code Status: full Family Communication: patient alone Disposition Plan:   Patient medically stable for transfer to psychiatry when bed available   Consultants:  Poison  Control  Psychiatry  Procedures:  CXR  Antibiotics:  None   HPI/Subjective:  Feeling much better.  Blood pressure and heart rate better.  Tongue and jaw much better.  No BM in several days.  Objective: Filed Vitals:   01/04/14 2300 01/05/14 0145 01/05/14 0500 01/05/14 0934  BP:  138/67 140/84 134/81  Pulse:  77 74 78  Temp:  98.7 F (37.1 C) 98.4 F (36.9 C)   TempSrc:  Oral Oral   Resp:  16 16   Height: 5\' 8"  (1.727 m)     Weight:      SpO2:  100% 98%     Intake/Output Summary (Last 24 hours) at 01/05/14 1012 Last data filed at 01/05/14 0851  Gross per 24 hour  Intake    880 ml  Output      0 ml  Net    880 ml   Filed Weights   01/02/14 2100 01/03/14 0228 01/04/14 0624  Weight: 109.501 kg (241 lb 6.5 oz) 105.5 kg (232 lb 9.4 oz) 102.8 kg (226 lb 10.1 oz)    Exam:   General:  BF, No acute distress  HEENT:  NCAT, MMM, no tongue fasciculations or swelling. Tongue and jaw normal  Cardiovascular:  RRR, nl S1, S2 no mrg, 2+ pulses, warm extremities  Respiratory:  CTAB, no increased WOB  Abdomen:   NABS, soft, NT/ND, Firm midline lower abdominal mass which feels like uterus close to [redacted] weeks GA, nontender  MSK:   Normal tone and bulk, no LEE  Neuro:  Grossly intact  Data Reviewed: Basic Metabolic Panel:  Recent Labs Lab 01/02/14 1920 01/02/14 1935 01/02/14 2217 01/03/14 0405 01/04/14 0357  NA 142  --  143 143 139  K 3.4*  --  5.0 3.8 4.2  CL 100  --  103 106 103  CO2 23  --  22 17* 24  GLUCOSE 113*  --  144* 145* 135*  BUN 10  --  8 6 9   CREATININE 0.77  --  0.51 0.49* 0.64  CALCIUM 9.8  --  8.8 8.5 9.6  MG  --  1.9  --   --   --    Liver Function Tests:  Recent Labs Lab 01/02/14 1920 01/02/14 2217 01/03/14 0405 01/04/14 0357  AST 53* 56* 39* 20  ALT 34 33 29 21  ALKPHOS 147* 121* 99 104  BILITOT 0.3 <0.2* 0.2* 0.4  PROT 7.9 7.5 7.2 6.9  ALBUMIN 3.7 3.3* 3.1* 3.2*   No results found for this basename: LIPASE, AMYLASE,  in the  last 168 hours No results found for this basename: AMMONIA,  in the last 168 hours CBC:  Recent Labs Lab 01/02/14 1920 01/03/14 0405 01/04/14 0357  WBC 7.4 5.3 3.5*  NEUTROABS 2.3  --   --   HGB 15.7* 14.6 13.8  HCT 45.5 42.9 41.1  MCV 90.5 89.2 89.5  PLT 216 225 183   Cardiac Enzymes:  Recent Labs Lab 01/02/14 2348  CKTOTAL 130  CKMB <1.0   BNP (last 3 results) No results found for this basename: PROBNP,  in the last 8760 hours CBG: No results found for this basename: GLUCAP,  in the last 168 hours  Recent Results (from the past 240 hour(s))  CULTURE, BLOOD (ROUTINE X 2)     Status: None   Collection Time    01/02/14  9:19 PM      Result Value Ref Range Status   Specimen Description BLOOD LEFT HAND   Final   Special Requests BOTTLES DRAWN AEROBIC AND ANAEROBIC 4CC   Final   Culture  Setup Time     Final   Value: 01/03/2014 00:46     Performed at [facility]   Culture     Final   Value:        BLOOD CULTURE RECEIVED NO GROWTH TO DATE CULTURE WILL BE HELD FOR 5 DAYS BEFORE ISSUING A FINAL NEGATIVE REPORT     Performed at [facility]   Report Status PENDING   Incomplete  CULTURE, BLOOD (ROUTINE X 2)     Status: None   Collection Time    01/02/14  9:19 PM      Result Value Ref Range Status   Specimen Description BLOOD RIGHT HAND   Final   Special Requests BOTTLES DRAWN AEROBIC AND ANAEROBIC 2CC   Final   Culture  Setup Time     Final   Value: 01/03/2014 00:46     Performed at [facility]   Culture     Final   Value:        BLOOD CULTURE RECEIVED NO GROWTH TO DATE CULTURE WILL BE HELD FOR 5 DAYS BEFORE ISSUING A FINAL NEGATIVE REPORT     Performed at [facility]   Report Status PENDING   Incomplete  MRSA PCR SCREENING     Status: None   Collection Time    01/03/14  2:31 AM      Result Value Ref Range Status   MRSA by PCR NEGATIVE  NEGATIVE Final   Comment:            The GeneXpert MRSA Assay (FDA  approved for NASAL  specimens     only), is one component of a     comprehensive MRSA colonization     surveillance program. It is not     intended to diagnose MRSA     infection nor to guide or     monitor treatment for     MRSA infections.     Studies: Dg Neck Soft Tissue  01/03/2014   CLINICAL DATA:  Sudden onset of neck swelling and lower jaw swelling. Assess airway.  EXAM: NECK SOFT TISSUES - 1+ VIEW  COMPARISON:  None.  FINDINGS: There appears to be a steeple sign, suspicious for subglottic tracheal narrowing. Adult croup is a concern, though per clinical correlation the patient has no signs of infection.  The trachea is unremarkable in appearance on the lateral view. The nasopharynx, oropharynx and hypopharynx are grossly unremarkable. The epiglottis is difficult to fully assess but appears grossly normal in caliber. The prevertebral soft tissues are within normal limits. No acute osseous abnormalities are seen. The visualized lung apices are grossly clear.  IMPRESSION: Steeple side noted, suspicious for subglottic tracheal narrowing. Adult croup is a concern, though per clinical correlation the patient has no signs of infection and this may simply reflect an allergic reaction. Would follow closely for clinical signs of airway compromise.  These results were called by telephone at the time of interpretation on 01/03/2014 at 9:32 pm to Debra [name] Nurse at the [facility], who verbally acknowledged these results.   Electronically Signed   By: Garald Balding M.D.   On: 01/03/2014 21:35    Scheduled Meds: . amLODipine  5 mg Oral Daily  . ARIPiprazole  5 mg Oral QHS  . atenolol  50 mg Oral BID  . cloNIDine  0.2 mg Oral TID  . diphenhydrAMINE  25 mg Intravenous Once  . enoxaparin (LOVENOX) injection  50 mg Subcutaneous Q24H  . FLUoxetine  20 mg Oral Daily  . folic acid  1 mg Oral Daily  . hydrochlorothiazide  25 mg Oral Daily  . methylPREDNISolone (SOLU-MEDROL) injection  50 mg Intravenous Once   . multivitamin with minerals  1 tablet Oral Daily  . ondansetron (ZOFRAN) IV  4 mg Intravenous Once  . pantoprazole  40 mg Oral Daily  . predniSONE  40 mg Oral Q breakfast  . thiamine  100 mg Oral Daily   Continuous Infusions:    Principal Problem:   Tylenol overdose Active Problems:   Suicidal ideation   Nausea and vomiting   Alcohol intoxication   Depression   Unspecified essential hypertension   Hypokalemia   Malignant hypertension   Sinus tachycardia    Time spent: 30 min    Kerianne Gurr, [place] [facility] Pager [phone]. If 7PM-7AM, please contact night-coverage at [email or web address], password [facility] 01/05/2014, 10:12 AM  LOS: 3 days

## 2014-01-05 NOTE — Progress Notes (Signed)
Clinical Social Work Department CLINICAL SOCIAL WORK PSYCHIATRY SERVICE LINE ASSESSMENT 01/05/2014  Patient:  Debra [name]  Account:  [id number]  [place] Date:  01/02/2014  Clinical Social Worker:  Sindy Messing, LCSW  Date/Time:  01/05/2014 02:00 PM Referred by:  Physician  Date referred:  01/05/2014 Reason for Referral  Psychosocial assessment   Presenting Symptoms/Problems (In the person's/family's own words):   Psych consulted due to overdose.   Abuse/Neglect/Trauma History (check all that apply)  Witness to trauma   Abuse/Neglect/Trauma Comments:   Patient reports difficult history but states she does not want to go into further detail.   Psychiatric History (check all that apply)  Outpatient treatment  Inpatient/hospitilization   Psychiatric medications:  Abilify 5 mg  Prozac 20 mg   Current Mental Health Hospitalizations/Previous Mental Health History:   Patient reports she has been diagnosed with depression in the past. Patient reports that she has not had any medication management or therapy in the past 8 years.   Current provider:   None currently   Place and Date:   N/A   Current Medications:   Scheduled Meds:      . amLODipine  5 mg Oral Daily  . ARIPiprazole  5 mg Oral QHS  . atenolol  50 mg Oral BID  . cloNIDine  0.2 mg Oral TID  . diphenhydrAMINE  25 mg Intravenous Once  . enoxaparin (LOVENOX) injection  50 mg Subcutaneous Q24H  . FLUoxetine  20 mg Oral Daily  . folic acid  1 mg Oral Daily  . hydrochlorothiazide  25 mg Oral Daily  . multivitamin with minerals  1 tablet Oral Daily  . ondansetron (ZOFRAN) IV  4 mg Intravenous Once  . pantoprazole  40 mg Oral Daily  . polyethylene glycol  17 g Oral Daily  . [START ON 01/06/2014] predniSONE  20 mg Oral Q breakfast  . thiamine  100 mg Oral Daily        Continuous Infusions:      PRN Meds:.diphenhydrAMINE, ibuprofen, LORazepam, methocarbamol, metoprolol, MUSCLE RUB, ondansetron (ZOFRAN) IV,  ondansetron       Previous Impatient Admission/Date/Reason:   Patient reports she was at [facility] about 8 years ago.   Emotional Health / Current Symptoms    Suicide/Self Harm  Suicide attempt in past (date/description)   Suicide attempt in the past:   Patient admitted after overdosing on medications and drinking alcohol. Patient admits to one previous attempt.   Other harmful behavior:   None reported   Psychotic/Dissociative Symptoms  Visual Hallucinations  Auditory Hallucinations   Other Psychotic/Dissociative Symptoms:   Patient admits to [facility] and [facility]. Patient states she sees black figures and sometimes when she is talking, her husband tells her she is talking to herself and nobody is actually present.    Attention/Behavioral Symptoms  Within Normal Limits   Other Attention / Behavioral Symptoms:   Patient engaged during assessment.    Cognitive Impairment  Within Normal Limits   Other Cognitive Impairment:   Patient alert and oriented throughout assessment.    Mood and Adjustment  Flat    Stress, Anxiety, Trauma, Any Recent Loss/Stressor  None reported   Anxiety (frequency):   N/A   Phobia (specify):   N/A   Compulsive behavior (specify):   N/A   Obsessive behavior (specify):   N/A   Other:   N/A   Substance Abuse/Use  Current substance use   SBIRT completed (please refer for detailed history):  Y  Self-reported substance use:  Patient reports that she drinks beer occasionally. Patient reports that she drinks socially but did drink a couple of beers on day of admission.   Urinary Drug Screen Completed:  Y Alcohol level:   327    Environmental/Housing/Living Arrangement  Stable housing   Who is in the home:   Husband   Emergency contact:  [place]   Patient's Strengths and Goals (patient's own words):   Patient reports that family is supportive.   Clinical Social Worker's Interpretive Summary:   CSW met  with patient and husband at bedside. Patient agreeable to husband involvement during assessment. CSW introduced myself and explained role.    Patient reports she is unemployed and lives at home with family. Patient admits to history of depression but is vague when describing triggers. Patient reports that she had a difficult past and feels that since she has never dealt with emotions that they have continued to add stress. Patient reports that she has felt overwhelmed and had to be hospitalized in the past as well.    Patient reports she was only admitted to one other hospital at [facility] about 8 years ago. Patient was prescribed medication and reports that therapy was helpful but has not had any follow up since hospitalization. Patient reports that PCP prescribes some medications but that she has been having a difficult time seeing PCP as well.    CSW spoke about psych MD recommendation for inpatient placement. Patient reports that she feels she does need immediate assistance and agreeable for inpatient setting. CSW explained DC plans and that CSW would search for available beds. Patient thanked CSW and agreeable to plans.   Disposition:  Inpatient referral made [facility], [facility], [place])   [place], [place] [phone]

## 2014-01-06 ENCOUNTER — Inpatient Hospital Stay (HOSPITAL_COMMUNITY)
Admission: AD | Admit: 2014-01-06 | Discharge: 2014-01-09 | DRG: 885 | Disposition: A | Discharge status: Home / Self Care | Payer: No Typology Code available for payment source | Source: Intra-hospital | Attending: Psychiatry | Admitting: Psychiatry

## 2014-01-06 ENCOUNTER — Encounter (HOSPITAL_COMMUNITY): Payer: Self-pay

## 2014-01-06 DIAGNOSIS — G4700 Insomnia, unspecified: Secondary | ICD-10-CM | POA: Diagnosis present

## 2014-01-06 DIAGNOSIS — R45851 Suicidal ideations: Secondary | ICD-10-CM

## 2014-01-06 DIAGNOSIS — F1994 Other psychoactive substance use, unspecified with psychoactive substance-induced mood disorder: Secondary | ICD-10-CM

## 2014-01-06 DIAGNOSIS — I10 Essential (primary) hypertension: Secondary | ICD-10-CM | POA: Diagnosis present

## 2014-01-06 DIAGNOSIS — F411 Generalized anxiety disorder: Secondary | ICD-10-CM | POA: Diagnosis present

## 2014-01-06 DIAGNOSIS — F1020 Alcohol dependence, uncomplicated: Secondary | ICD-10-CM | POA: Diagnosis present

## 2014-01-06 DIAGNOSIS — Z833 Family history of diabetes mellitus: Secondary | ICD-10-CM

## 2014-01-06 DIAGNOSIS — F319 Bipolar disorder, unspecified: Secondary | ICD-10-CM

## 2014-01-06 DIAGNOSIS — K219 Gastro-esophageal reflux disease without esophagitis: Secondary | ICD-10-CM | POA: Diagnosis present

## 2014-01-06 DIAGNOSIS — F17200 Nicotine dependence, unspecified, uncomplicated: Secondary | ICD-10-CM | POA: Diagnosis present

## 2014-01-06 DIAGNOSIS — F332 Major depressive disorder, recurrent severe without psychotic features: Secondary | ICD-10-CM | POA: Diagnosis present

## 2014-01-06 HISTORY — DX: Major depressive disorder, recurrent severe without psychotic features: F33.2

## 2014-01-06 HISTORY — DX: Bipolar disorder, unspecified: F31.9

## 2014-01-06 MED ORDER — ADULT MULTIVITAMIN W/MINERALS CH
1.0000 | ORAL_TABLET | Freq: Every day | ORAL | Status: DC
  Filled 2014-01-06 (×2): qty 1

## 2014-01-06 MED ORDER — BISACODYL 5 MG PO TBEC
10.0000 mg | DELAYED_RELEASE_TABLET | Freq: Every day | ORAL | Status: DC | PRN
  Administered 2014-01-06: 10 mg via ORAL
  Filled 2014-01-06: qty 2

## 2014-01-06 MED ORDER — HYDROCHLOROTHIAZIDE 25 MG PO TABS: 25.0000 mg | ORAL_TABLET | Freq: Every day | ORAL | Status: DC

## 2014-01-06 MED ORDER — CLONIDINE HCL 0.2 MG PO TABS
0.2000 mg | ORAL_TABLET | Freq: Three times a day (TID) | ORAL | Status: DC
Start: 2014-01-07 — End: 2014-01-09
  Administered 2014-01-07 – 2014-01-09 (×8): 0.2 mg via ORAL
  Filled 2014-01-06: qty 42
  Filled 2014-01-06: qty 1
  Filled 2014-01-06: qty 42
  Filled 2014-01-06: qty 1
  Filled 2014-01-06 (×2): qty 42
  Filled 2014-01-06: qty 1
  Filled 2014-01-06: qty 42
  Filled 2014-01-06 (×2): qty 1
  Filled 2014-01-06: qty 42
  Filled 2014-01-06 (×5): qty 1

## 2014-01-06 MED ORDER — THIAMINE HCL 100 MG/ML IJ SOLN: 100.0000 mg | Freq: Once | INTRAMUSCULAR | Status: DC

## 2014-01-06 MED ORDER — ARIPIPRAZOLE 5 MG PO TABS: 5.0000 mg | ORAL_TABLET | Freq: Every day | ORAL | Status: DC

## 2014-01-06 MED ORDER — PANTOPRAZOLE SODIUM 40 MG PO TBEC: 40.0000 mg | DELAYED_RELEASE_TABLET | Freq: Every day | ORAL | Status: DC

## 2014-01-06 MED ORDER — MAGNESIUM HYDROXIDE 400 MG/5ML PO SUSP: 30.0000 mL | Freq: Every day | ORAL | Status: DC | PRN

## 2014-01-06 MED ORDER — CHLORDIAZEPOXIDE HCL 25 MG PO CAPS: 25.0000 mg | ORAL_CAPSULE | Freq: Four times a day (QID) | ORAL | Status: DC

## 2014-01-06 MED ORDER — FLUOXETINE HCL 20 MG PO CAPS
20.0000 mg | ORAL_CAPSULE | Freq: Every day | ORAL | Status: DC
  Administered 2014-01-07 – 2014-01-09 (×3): 20 mg via ORAL
  Filled 2014-01-06: qty 14
  Filled 2014-01-06 (×3): qty 1
  Filled 2014-01-06: qty 14
  Filled 2014-01-06: qty 1

## 2014-01-06 MED ORDER — LOPERAMIDE HCL 2 MG PO CAPS: 2.0000 mg | ORAL_CAPSULE | ORAL | Status: DC | PRN

## 2014-01-06 MED ORDER — HYDROXYZINE HCL 25 MG PO TABS: 25.0000 mg | ORAL_TABLET | Freq: Four times a day (QID) | ORAL | Status: DC | PRN

## 2014-01-06 MED ORDER — PANTOPRAZOLE SODIUM 40 MG PO TBEC
40.0000 mg | DELAYED_RELEASE_TABLET | Freq: Every day | ORAL | Status: DC
  Administered 2014-01-07 – 2014-01-09 (×3): 40 mg via ORAL
  Filled 2014-01-06 (×5): qty 1

## 2014-01-06 MED ORDER — FLUOXETINE HCL 20 MG PO CAPS: 20.0000 mg | ORAL_CAPSULE | Freq: Every day | ORAL | Status: DC

## 2014-01-06 MED ORDER — ATENOLOL 50 MG PO TABS
50.0000 mg | ORAL_TABLET | Freq: Two times a day (BID) | ORAL | Status: DC
  Administered 2014-01-06 – 2014-01-09 (×6): 50 mg via ORAL
  Filled 2014-01-06 (×2): qty 1
  Filled 2014-01-06: qty 2
  Filled 2014-01-06 (×8): qty 1

## 2014-01-06 MED ORDER — HYDROCHLOROTHIAZIDE 25 MG PO TABS
25.0000 mg | ORAL_TABLET | Freq: Every day | ORAL | Status: DC
  Administered 2014-01-07 – 2014-01-09 (×3): 25 mg via ORAL
  Filled 2014-01-06 (×5): qty 1

## 2014-01-06 MED ORDER — AMLODIPINE BESYLATE 5 MG PO TABS
5.0000 mg | ORAL_TABLET | Freq: Every day | ORAL | Status: DC
  Administered 2014-01-07 – 2014-01-09 (×3): 5 mg via ORAL
  Filled 2014-01-06 (×5): qty 1

## 2014-01-06 MED ORDER — CHLORDIAZEPOXIDE HCL 25 MG PO CAPS: 25.0000 mg | ORAL_CAPSULE | Freq: Four times a day (QID) | ORAL | Status: DC | PRN

## 2014-01-06 MED ORDER — ARIPIPRAZOLE 5 MG PO TABS
5.0000 mg | ORAL_TABLET | Freq: Every day | ORAL | Status: DC
  Administered 2014-01-06: 5 mg via ORAL
  Filled 2014-01-06 (×4): qty 1

## 2014-01-06 MED ORDER — BISACODYL 5 MG PO TBEC: 10.0000 mg | DELAYED_RELEASE_TABLET | Freq: Every day | ORAL | Status: DC | PRN

## 2014-01-06 MED ORDER — SENNA 8.6 MG PO TABS: 2.0000 | ORAL_TABLET | Freq: Every day | ORAL | Status: DC

## 2014-01-06 MED ORDER — CHLORDIAZEPOXIDE HCL 25 MG PO CAPS: 25.0000 mg | ORAL_CAPSULE | Freq: Three times a day (TID) | ORAL | Status: DC

## 2014-01-06 MED ORDER — NAPROXEN 500 MG PO TABS
500.0000 mg | ORAL_TABLET | Freq: Two times a day (BID) | ORAL | Status: DC
  Administered 2014-01-06 – 2014-01-09 (×6): 500 mg via ORAL
  Filled 2014-01-06 (×11): qty 1

## 2014-01-06 MED ORDER — TRAZODONE HCL 50 MG PO TABS
50.0000 mg | ORAL_TABLET | Freq: Every evening | ORAL | Status: DC | PRN
  Administered 2014-01-06 – 2014-01-08 (×3): 50 mg via ORAL
  Filled 2014-01-06: qty 28
  Filled 2014-01-06: qty 1
  Filled 2014-01-06: qty 28
  Filled 2014-01-06 (×5): qty 1
  Filled 2014-01-06: qty 28
  Filled 2014-01-06 (×2): qty 1
  Filled 2014-01-06: qty 28

## 2014-01-06 MED ORDER — MUSCLE RUB 10-15 % EX CREA: 1.0000 "application " | TOPICAL_CREAM | CUTANEOUS | Status: DC | PRN

## 2014-01-06 MED ORDER — VITAMIN B-1 100 MG PO TABS
100.0000 mg | ORAL_TABLET | Freq: Every day | ORAL | Status: DC
  Filled 2014-01-06: qty 1

## 2014-01-06 MED ORDER — CHLORDIAZEPOXIDE HCL 25 MG PO CAPS: 25.0000 mg | ORAL_CAPSULE | Freq: Every day | ORAL | Status: DC

## 2014-01-06 MED ORDER — POLYETHYLENE GLYCOL 3350 17 G PO PACK: 17.0000 g | PACK | Freq: Two times a day (BID) | ORAL | Status: DC

## 2014-01-06 MED ORDER — POLYETHYLENE GLYCOL 3350 17 G PO PACK
17.0000 g | PACK | Freq: Two times a day (BID) | ORAL | Status: DC
  Administered 2014-01-07: 17 g via ORAL
  Filled 2014-01-06 (×10): qty 1

## 2014-01-06 MED ORDER — ATENOLOL 50 MG PO TABS: 50.0000 mg | ORAL_TABLET | Freq: Two times a day (BID) | ORAL | Status: DC

## 2014-01-06 MED ORDER — DOCUSATE SODIUM 100 MG PO CAPS
100.0000 mg | ORAL_CAPSULE | Freq: Two times a day (BID) | ORAL | Status: DC
  Administered 2014-01-06: 100 mg via ORAL
  Filled 2014-01-06 (×2): qty 1

## 2014-01-06 MED ORDER — DSS 100 MG PO CAPS: 100.0000 mg | ORAL_CAPSULE | Freq: Two times a day (BID) | ORAL | Status: DC

## 2014-01-06 MED ORDER — ONDANSETRON 4 MG PO TBDP: 4.0000 mg | ORAL_TABLET | Freq: Four times a day (QID) | ORAL | Status: DC | PRN

## 2014-01-06 MED ORDER — MAGNESIUM CITRATE PO SOLN: 1.0000 | Freq: Once | ORAL | Status: DC | PRN

## 2014-01-06 MED ORDER — AMLODIPINE BESYLATE 5 MG PO TABS: 5.0000 mg | ORAL_TABLET | Freq: Every day | ORAL | Status: DC

## 2014-01-06 MED ORDER — CHLORDIAZEPOXIDE HCL 25 MG PO CAPS: 25.0000 mg | ORAL_CAPSULE | ORAL | Status: DC

## 2014-01-06 MED ORDER — ALUM & MAG HYDROXIDE-SIMETH 200-200-20 MG/5ML PO SUSP: 30.0000 mL | ORAL | Status: DC | PRN

## 2014-01-06 MED ORDER — CLONIDINE HCL 0.2 MG PO TABS: 0.2000 mg | ORAL_TABLET | Freq: Three times a day (TID) | ORAL | Status: DC

## 2014-01-06 NOTE — Progress Notes (Signed)
Clinical Social Work  Patient accepted to [facility] 307-2. RN to call report to [phone]. [facility] requests transfer after 5:30pm. CSW met with patient and spouse at bedside and explained DC plans. Patient signed voluntary form and reports she is happy to get treatment. Voluntary form faxed to [facility] and CSW placed original copy on chart. CSW coordinated transportation via [place] for 5:30pm. RN and MD aware and agreeable to plans.  [place], [place] [phone]

## 2014-01-06 NOTE — Progress Notes (Signed)
Patient ID: Debra [name], female   DOB: 01-28-1966, 48 y.o.   MRN: [id number] Pt is a 48 yr old female that came in with an OD attempt. Pt consumed approx. 30 Tylenol pm and downed it with a pint of vodka. Pt stated she doesn't think she was trying to harm self, but everyone is telling her that she was. Pt has had 3 previous si attempts in the past. Pt stated she has bipolar and has been non med compliant since she cannot afford her medications. Pt support system is her family and husband. Denies si/hi/avh. Rates pain 8/10 in lower back. Meal given. Pt introduced to unit. Pt remains safe on unit.

## 2014-01-06 NOTE — Progress Notes (Signed)
Report received from E. Pelletier, RN. I agree with previous RN's assessment. Will continue to monitor pt closely. [place], Ciena Sampley I  

## 2014-01-06 NOTE — Progress Notes (Signed)
Clinical Social Work  CSW spoke with [facility] Randall Hiss) at [facility] who anticipates a bed will be available today for patient. AC will call CSW back once bed status has been determined.  [place], [place] [phone]

## 2014-01-06 NOTE — Progress Notes (Signed)
Adult Psychoeducational Group Note  Date:  01/06/2014 Time:  11:05 PM  Group Topic/Focus:  Wrap-Up Group:   The focus of this group is to help patients review their daily goal of treatment and discuss progress on daily workbooks.  Participation Level:  Active  Participation Quality:  Appropriate  Affect:  Appropriate  Cognitive:  Alert and Oriented  Insight: Appropriate  Engagement in Group:  Developing/Improving  Modes of Intervention:  Exploration, Problem-solving and Support  Additional Comments:  Patient did become tearful during group.  Patient stated that she learned from her recovery group is that she wants to live. Patient verbalized that she has had issues with expressing her feelings and feeling like she was on her own.   Debra [name], [place] 01/06/2014, 11:05 PM

## 2014-01-06 NOTE — Progress Notes (Signed)
Report called to [place] at [facility]. Transport arranged for 1830 via [place].
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
## 2014-01-06 NOTE — Discharge Summary (Addendum)
Physician Discharge Summary  Debra [name] YYT:[id number] DOB: Oct 23, 1965 DOA: 01/02/2014  PCP: Mack Hook, MD  Admit date: 01/02/2014 Discharge date: 01/06/2014  Recommendations for Outpatient Follow-up:  -  Transfer to behavioral health for ongoing care. -  F/u with GYN at already scheduled appointemnt for fibroids -  PCP to repeat TFTs in 4 weeks  Discharge Diagnoses:  Principal Problem:   Tylenol overdose Active Problems:   Suicidal ideation   Nausea and vomiting   Alcohol intoxication   Depression   Unspecified essential hypertension   Hypokalemia   Malignant hypertension   Sinus tachycardia   Discharge Condition: stable, improved  Diet recommendation: healthy heart  Wt Readings from Last 3 Encounters:  01/04/14 102.8 kg (226 lb 10.1 oz)  07/26/12 109.317 kg (241 lb)  03/17/08 106.595 kg (235 lb)    History of present illness:  Debra [name] is a 48 y.o. female with a history of HTN, Depression, and Bipolar Disorder who was brought to the ED after ingesting 25 -30 Tylenol PM Tablets and drinking 1 pint of vodka. She reports that she was trying to kill herself Because she is so depressed. She reports being depressed for a long time and being on medication for depression in the past. Her ingestion occurred at 1800, per her sister and EMS was called. In the ED , she began to have nausea and vomiting. Her initial tylenol level was 125, and at 3 hours was 80. Poison control was contacted and patient was started on IV Mucomyst and the Tylenol Overdose protocol and referred for medical admission.   Hospital Course:   Tylenol overdose, initial level 125 at 8h, above treatment threshold. Poison control was notified and she received N-acetylcysteine.  Her tylenol trended down to undetectable levels.  Her liver function tests were minimally elevated and trended to normal and her INR remained stable.  She treatment completed 7/18   Suicide attempt  by drug overdose. Depression and anxiety, appreciate psychiatry assistance  - Started abilify and prozac on 7/19  - Transfer to inpatient psychiatry when bed available   EtOH intoxication and withdrawal, CIWA scores low, no ativan in more than 24 hours  - Continue thiamine, folate, and MVI   Malignant hypertension, blood pressure markedly improved after starting atenolol, clonidine, HCTZ and norvasc   Angioedema from ACEI, resolved with stopping medication. She completed a Cort Dragoo prednisone taper in the hospital.    Sinus tachycardia, likely due to withdrawal. No dyspnea to suggest PE. Resolved.  - Continue BB and clonidine   Acute hypoxic respiratory failure likely secondary to atelectasis during period of initial lethargy and resolved.   Nausea and vomiting due to overdose, resolved. Tolerating diet   Constipation, continue colace, senna, miralax.  May use magnesium citrate prn  Positive anion gap metabolic acidosis due to drug overdose, N-Ac treatment, resolved with IVF   Sick euthyroid with decreased TSH and normal fT3 and fT4, likely secondary to steroids  - Repeat TFTs in 4 weeks   Midline lower abdominal mass, likely fibroids which were seen on CT and [place] several years ago  - Pt already has GYN follow up arranged   Chronic back pain, continue robaxin prn   Consultants:  Poison Control  Psychiatry Procedures:  CXR Antibiotics:  None    Discharge Exam: Filed Vitals:   01/06/14 1349  BP: 113/64  Pulse: 76  Temp: 98.3 F (36.8 C)  Resp: 18   Filed Vitals:   01/05/14 1333 01/05/14 2129 01/06/14 0610 01/06/14  1349  BP: 131/69 150/86 126/76 113/64  Pulse: 82 74 77 76  Temp: 98.8 F (37.1 C) 98 F (36.7 C) 98.2 F (36.8 C) 98.3 F (36.8 C)  TempSrc: Oral Oral Oral Oral  Resp: 16 18 16 18   Height:      Weight:      SpO2: 100% 98% 99% 98%    General: BF, No acute distress  HEENT: NCAT, MMM, Tongue and jaw normal  Cardiovascular: RRR, nl S1, S2 no mrg, 2+  pulses, warm extremities  Respiratory: CTAB, no increased WOB  Abdomen: NABS, soft, NT/ND, Firm midline lower abdominal mass which feels like uterus close to [redacted] weeks GA, nontender.  Fullness on exam.   MSK: Normal tone and bulk, no LEE  Neuro: Grossly intact   Discharge Instructions      Discharge Instructions   Call MD for:  difficulty breathing, headache or visual disturbances    Complete by:  As directed      Call MD for:  extreme fatigue    Complete by:  As directed      Call MD for:  hives    Complete by:  As directed      Call MD for:  persistant dizziness or light-headedness    Complete by:  As directed      Call MD for:  persistant nausea and vomiting    Complete by:  As directed      Call MD for:  severe uncontrolled pain    Complete by:  As directed      Call MD for:  temperature >100.4    Complete by:  As directed      Diet - low sodium heart healthy    Complete by:  As directed      Increase activity slowly    Complete by:  As directed             Medication List    STOP taking these medications       diphenhydramine-acetaminophen 25-500 MG Tabs  Commonly known as:  TYLENOL PM      TAKE these medications       amLODipine 5 MG tablet  Commonly known as:  NORVASC  Take 1 tablet (5 mg total) by mouth daily.     ARIPiprazole 5 MG tablet  Commonly known as:  ABILIFY  Take 1 tablet (5 mg total) by mouth at bedtime.     atenolol 50 MG tablet  Commonly known as:  TENORMIN  Take 1 tablet (50 mg total) by mouth 2 (two) times daily.     bisacodyl 5 MG EC tablet  Commonly known as:  DULCOLAX  Take 2 tablets (10 mg total) by mouth daily as needed for mild constipation or moderate constipation.     cloNIDine 0.2 MG tablet  Commonly known as:  CATAPRES  Take 1 tablet (0.2 mg total) by mouth 3 (three) times daily.     DSS 100 MG Caps  Take 100 mg by mouth 2 (two) times daily.     FLUoxetine 20 MG capsule  Commonly known as:  PROZAC  Take 1 capsule (20  mg total) by mouth daily.     hydrochlorothiazide 25 MG tablet  Commonly known as:  HYDRODIURIL  Take 1 tablet (25 mg total) by mouth daily.     magnesium citrate Soln  Take 296 mLs (1 Bottle total) by mouth once as needed for moderate constipation or severe constipation.     MUSCLE RUB 10-15 %  Crea  Apply 1 application topically as needed for muscle pain.     pantoprazole 40 MG tablet  Commonly known as:  PROTONIX  Take 1 tablet (40 mg total) by mouth daily.     polyethylene glycol packet  Commonly known as:  MIRALAX / GLYCOLAX  Take 17 g by mouth 2 (two) times daily.       Follow-up Information   Follow up with [facility], MD. Schedule an appointment as soon as possible for a visit in 2 weeks.   Specialty:  Internal Medicine   Contact information:   [place] [address] [phone]        The results of significant diagnostics from this hospitalization (including imaging, microbiology, ancillary and laboratory) are listed below for reference.    Significant Diagnostic Studies: Dg Neck Soft Tissue  01/03/2014   CLINICAL DATA:  Sudden onset of neck swelling and lower jaw swelling. Assess airway.  EXAM: NECK SOFT TISSUES - 1+ VIEW  COMPARISON:  None.  FINDINGS: There appears to be a steeple sign, suspicious for subglottic tracheal narrowing. Adult croup is a concern, though per clinical correlation the patient has no signs of infection.  The trachea is unremarkable in appearance on the lateral view. The nasopharynx, oropharynx and hypopharynx are grossly unremarkable. The epiglottis is difficult to fully assess but appears grossly normal in caliber. The prevertebral soft tissues are within normal limits. No acute osseous abnormalities are seen. The visualized lung apices are grossly clear.  IMPRESSION: Steeple side noted, suspicious for subglottic tracheal narrowing. Adult croup is a concern, though per clinical correlation the patient has no  signs of infection and this may simply reflect an allergic reaction. Would follow closely for clinical signs of airway compromise.  These results were called by telephone at the time of interpretation on 01/03/2014 at 9:32 pm to Dot Lanes Nurse at the [facility], who verbally acknowledged these results.   Electronically Signed   By: Garald Balding M.D.   On: 01/03/2014 21:35   Dg Chest Portable 1 View  01/02/2014   CLINICAL DATA:  Hypoxemia.  EXAM: PORTABLE CHEST - 1 VIEW  COMPARISON:  January 02, 2007.  FINDINGS: The heart size and mediastinal contours are within normal limits. Both lungs are clear. Hypoinflation of the lungs is noted most likely due to poor inspiratory effort. No pneumothorax or pleural effusion is noted. The visualized skeletal structures are unremarkable.  IMPRESSION: No acute cardiopulmonary abnormality seen.   Electronically Signed   By: Sabino Dick M.D.   On: 01/02/2014 20:36    Microbiology: Recent Results (from the past 240 hour(s))  CULTURE, BLOOD (ROUTINE X 2)     Status: None   Collection Time    01/02/14  9:19 PM      Result Value Ref Range Status   Specimen Description BLOOD LEFT HAND   Final   Special Requests BOTTLES DRAWN AEROBIC AND ANAEROBIC 4CC   Final   Culture  Setup Time     Final   Value: 01/03/2014 00:46     Performed at [facility]   Culture     Final   Value:        BLOOD CULTURE RECEIVED NO GROWTH TO DATE CULTURE WILL BE HELD FOR 5 DAYS BEFORE ISSUING A FINAL NEGATIVE REPORT     Performed at [facility]   Report Status PENDING   Incomplete  CULTURE, BLOOD (ROUTINE X 2)  Status: None   Collection Time    01/02/14  9:19 PM      Result Value Ref Range Status   Specimen Description BLOOD RIGHT HAND   Final   Special Requests BOTTLES DRAWN AEROBIC AND ANAEROBIC 2CC   Final   Culture  Setup Time     Final   Value: 01/03/2014 00:46     Performed at [facility]   Culture     Final   Value:        BLOOD  CULTURE RECEIVED NO GROWTH TO DATE CULTURE WILL BE HELD FOR 5 DAYS BEFORE ISSUING A FINAL NEGATIVE REPORT     Performed at [facility]   Report Status PENDING   Incomplete  MRSA PCR SCREENING     Status: None   Collection Time    01/03/14  2:31 AM      Result Value Ref Range Status   MRSA by PCR NEGATIVE  NEGATIVE Final   Comment:            The GeneXpert MRSA Assay (FDA     approved for NASAL specimens     only), is one component of a     comprehensive MRSA colonization     surveillance program. It is not     intended to diagnose MRSA     infection nor to guide or     monitor treatment for     MRSA infections.     Labs: Basic Metabolic Panel:  Recent Labs Lab 01/02/14 1920 01/02/14 1935 01/02/14 2217 01/03/14 0405 01/04/14 0357  NA 142  --  143 143 139  K 3.4*  --  5.0 3.8 4.2  CL 100  --  103 106 103  CO2 23  --  22 17* 24  GLUCOSE 113*  --  144* 145* 135*  BUN 10  --  8 6 9   CREATININE 0.77  --  0.51 0.49* 0.64  CALCIUM 9.8  --  8.8 8.5 9.6  MG  --  1.9  --   --   --    Liver Function Tests:  Recent Labs Lab 01/02/14 1920 01/02/14 2217 01/03/14 0405 01/04/14 0357  AST 53* 56* 39* 20  ALT 34 33 29 21  ALKPHOS 147* 121* 99 104  BILITOT 0.3 <0.2* 0.2* 0.4  PROT 7.9 7.5 7.2 6.9  ALBUMIN 3.7 3.3* 3.1* 3.2*   No results found for this basename: LIPASE, AMYLASE,  in the last 168 hours No results found for this basename: AMMONIA,  in the last 168 hours CBC:  Recent Labs Lab 01/02/14 1920 01/03/14 0405 01/04/14 0357  WBC 7.4 5.3 3.5*  NEUTROABS 2.3  --   --   HGB 15.7* 14.6 13.8  HCT 45.5 42.9 41.1  MCV 90.5 89.2 89.5  PLT 216 225 183   Cardiac Enzymes:  Recent Labs Lab 01/02/14 2348  CKTOTAL 130  CKMB <1.0   BNP: BNP (last 3 results) No results found for this basename: PROBNP,  in the last 8760 hours CBG: No results found for this basename: GLUCAP,  in the last 168 hours  Time coordinating discharge: 45  minutes  Signed:  Tayen Narang  [facility] 01/06/2014, 4:56 PM

## 2014-01-07 DIAGNOSIS — F3130 Bipolar disorder, current episode depressed, mild or moderate severity, unspecified: Secondary | ICD-10-CM

## 2014-01-07 MED ORDER — ARIPIPRAZOLE 5 MG PO TABS
5.0000 mg | ORAL_TABLET | Freq: Two times a day (BID) | ORAL | Status: DC
  Administered 2014-01-07 – 2014-01-09 (×4): 5 mg via ORAL
  Filled 2014-01-07: qty 14
  Filled 2014-01-07 (×3): qty 1
  Filled 2014-01-07: qty 14
  Filled 2014-01-07 (×2): qty 1
  Filled 2014-01-07: qty 14
  Filled 2014-01-07: qty 1
  Filled 2014-01-07: qty 14
  Filled 2014-01-07: qty 1

## 2014-01-07 NOTE — Progress Notes (Signed)
Pt has been up and active in the milieu today. She rated her depression 5 hopelessness and anxiety a 5 on her self-inventory. She denies any S/H ideation or A/V/H.  She is wanting to do out-patient from here.
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
## 2014-01-07 NOTE — BHH Suicide Risk Assessment (Signed)
   Nursing information obtained from:    Demographic factors:    Current Mental Status:    Loss Factors:    Historical Factors:    Risk Reduction Factors:    Total Time spent with patient: 45 minutes  CLINICAL FACTORS:   Bipolar Disorder:   Mixed State Alcohol dependence  Psychiatric Specialty Exam: Physical Exam  ROS  Blood pressure 120/66, pulse 83, temperature 98.3 F (36.8 C), temperature source Oral, resp. rate 18, height 5\' 8"  (1.727 m), weight 230 lb (104.327 kg).Body mass index is 34.98 kg/(m^2).  General Appearance: Casual  Eye Contact::  Fair  Speech:  Normal Rate  Volume:  Normal  Mood:  Depressed and Irritable  Affect:  Labile  Thought Process:  Goal Directed and Logical  Orientation:  Full (Time, Place, and Person)  Thought Content:  Rumination  Suicidal Thoughts:  No  Homicidal Thoughts:  No  Memory:  Immediate;   Fair Recent;   Fair Remote;   Fair  Judgement:  Impaired  Insight:  Lacking  Psychomotor Activity:  Increased  Concentration:  Fair  Recall:  [place]  Language: Fair  Akathisia:  No  Handed:  Right  AIMS (if indicated):     Assets:  Communication Skills Desire for Improvement Housing  Sleep:  Number of Hours: 5.75   Musculoskeletal: Strength & Muscle Tone: within normal limits Gait & Station: normal Patient leans: N/A  COGNITIVE FEATURES THAT CONTRIBUTE TO RISK:  Closed-mindedness Loss of executive function Polarized thinking    SUICIDE RISK:   Moderate:  Frequent suicidal ideation with limited intensity, and duration, some specificity in terms of plans, no associated intent, good self-control, limited dysphoria/symptomatology, some risk factors present, and identifiable protective factors, including available and accessible social support.  PLAN OF CARE:  I certify that inpatient services furnished can reasonably be expected to improve the patient's condition.  Shanitra Phillippi T. 01/07/2014, 5:53 PM
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
## 2014-01-07 NOTE — Progress Notes (Signed)
Did nt attend

## 2014-01-07 NOTE — Progress Notes (Signed)
Attended group 

## 2014-01-07 NOTE — Progress Notes (Signed)
D: Pt denies SI/HI/AVH. Pt is pleasant and cooperative. Pt said she is feeling a little better today, but still a little worried about what she is going to do when she leaves.   A: Pt was offered support and encouragement. Pt was given scheduled medications. Pt was encourage to attend groups. Q 15 minute checks were done for safety.   R:Pt attends groups and interacts well with peers and staff. Pt is taking medication. Pt has no complaints at this time .Pt receptive to treatment and safety maintained on unit.
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
## 2014-01-08 DIAGNOSIS — F332 Major depressive disorder, recurrent severe without psychotic features: Principal | ICD-10-CM

## 2014-01-08 NOTE — Progress Notes (Signed)
[facility] MD Progress Note  01/08/2014 11:39 AM Debra [name]  MRN:  [id number]  Subjective: Debra [name] says she is doing much better today. She states that she was told that she too bunch of pills, probably to hurt herself, only that she does not remember why, how and when. She rates her depression and anxiety at #0.   Diagnosis:   DSM5: Schizophrenia Disorders:  NA Obsessive-Compulsive Disorders:  NA Trauma-Stressor Disorders:  NA Substance/Addictive Disorders:  NA Depressive Disorders:  MDD (major depressive disorder), recurrent episode, severe  Axis I: MDD (major depressive disorder), recurrent episode, severe Axis II: Deferred Axis III:  Past Medical History  Diagnosis Date  . Acid reflux   . Hypertension   . Bipolar disorder    Axis IV: other psychosocial or environmental problems and Mental illness, chronic Axis V: 41-50 serious symptoms  ADL's:  Intact  Sleep: Fair  Appetite:  Fair  Suicidal Ideation:  Plan:  Denies Intent:  Denies Means:  Denies  Homicidal Ideation:  Plan:  Denies Intent:  Denies Means:  denies  AEB (as evidenced by):  Psychiatric Specialty Exam: Physical Exam  Review of Systems  Psychiatric/Behavioral: Positive for depression. Negative for suicidal ideas, hallucinations, memory loss and substance abuse. The patient is nervous/anxious and has insomnia.     Blood pressure 116/66, pulse 81, temperature 98.4 F (36.9 C), temperature source Oral, resp. rate 18, height 5\' 8"  (1.727 m), weight 104.327 kg (230 lb).Body mass index is 34.98 kg/(m^2).  General Appearance: Casual and Fairly Groomed  Engineer, water::  Fair  Speech:  Clear and Coherent  Volume:  Normal  Mood:  Euthymic  Affect:  Congruent  Thought Process:  Coherent and Intact  Orientation:  Full (Time, Place, and Person)  Thought Content:  Rumination  Suicidal Thoughts:  No  Homicidal Thoughts:  No  Memory:  Immediate;   Good Recent;   Good Remote;   Good   Judgement:  Fair  Insight:  Fair  Psychomotor Activity:  Normal  Concentration:  Fair  Recall:  Good  Fund of Knowledge:Fair  Language: Fair  Akathisia:  No  Handed:  Right  AIMS (if indicated):     Assets:  Communication Skills Desire for Improvement  Sleep:  Number of Hours: 6.75   Musculoskeletal: Strength & Muscle Tone: within normal limits Gait & Station: normal Patient leans: N/A  Current Medications: Current Facility-Administered Medications  Medication Dose Route Frequency Provider Last Rate Last Dose  . alum & mag hydroxide-simeth (MAALOX/MYLANTA) 200-200-20 MG/5ML suspension 30 mL  30 mL Oral Q4H PRN Laverle Hobby, PA-C      . amLODipine (NORVASC) tablet 5 mg  5 mg Oral Daily Laverle Hobby, PA-C   5 mg at 01/08/14 0839  . ARIPiprazole (ABILIFY) tablet 5 mg  5 mg Oral BID Benjamine Mola, FNP   5 mg at 01/08/14 0841  . atenolol (TENORMIN) tablet 50 mg  50 mg Oral BID Laverle Hobby, PA-C   50 mg at 01/08/14 0840  . bisacodyl (DULCOLAX) EC tablet 10 mg  10 mg Oral Daily PRN Laverle Hobby, PA-C      . cloNIDine (CATAPRES) tablet 0.2 mg  0.2 mg Oral TID Laverle Hobby, PA-C   0.2 mg at 01/08/14 0651  . FLUoxetine (PROZAC) capsule 20 mg  20 mg Oral Daily Laverle Hobby, PA-C   20 mg at 01/08/14 0840  . hydrochlorothiazide (HYDRODIURIL) tablet 25 mg  25 mg Oral Daily Laverle Hobby, PA-C  25 mg at 01/08/14 0651  . magnesium hydroxide (MILK OF MAGNESIA) suspension 30 mL  30 mL Oral Daily PRN Laverle Hobby, PA-C      . naproxen (NAPROSYN) tablet 500 mg  500 mg Oral BID WC Laverle Hobby, PA-C   500 mg at 01/08/14 0839  . pantoprazole (PROTONIX) EC tablet 40 mg  40 mg Oral Daily Laverle Hobby, PA-C   40 mg at 01/08/14 6761  . polyethylene glycol (MIRALAX / GLYCOLAX) packet 17 g  17 g Oral BID Laverle Hobby, PA-C   17 g at 01/07/14 0820  . traZODone (DESYREL) tablet 50 mg  50 mg Oral QHS,MR X 1 Laverle Hobby, PA-C   50 mg at 01/07/14 2140    Lab Results: No  results found for this or any previous visit (from the past 48 hour(s)).  Physical Findings: AIMS:  , ,  ,  ,    CIWA:  CIWA-Ar Total: 1 COWS:     Treatment Plan Summary: Daily contact with patient to assess and evaluate symptoms and progress in treatment Medication management  Plan: 1. Continue crisis management and stabilization.  2. Medication management: Continue current treatment plan 3. Encouraged patient to attend groups and participate in group counseling sessions and activities.  4. Address health issues: Vitals reviewed and stable.   Medical Decision Making Problem Points:  Established problem, stable/improving (1), Review of last therapy session (1) and Review of psycho-social stressors (1) Data Points:  Review of medication regiment & side effects (2) Review of new medications or change in dosage (2)  I certify that inpatient services furnished can reasonably be expected to improve the patient's condition.   Encarnacion Slates, [place] 01/08/2014, 11:39 AM  I agreed with the findings, treatment and disposition plan of this patient. Berniece Andreas, MD

## 2014-01-08 NOTE — BHH Group Notes (Signed)
[facility] LCSW Group Therapy  01/08/2014 3:26 PM  Type of Therapy:  Group Therapy  Participation Level:  Did Not Attend-pt in room resting.    Smart, Debra [name] LCSWA  01/08/2014, 3:26 PM

## 2014-01-08 NOTE — BHH Suicide Risk Assessment (Signed)
[place] INPATIENT:  Family/Significant Other Suicide Prevention Education  Suicide Prevention Education:  Contact Attempts: Debra [name] (pt's husband) has been identified by the patient as the family member/significant other with whom the patient will be residing, and identified as the person(s) who will aid the patient in the event of a mental health crisis.  With written consent from the patient, two attempts were made to provide suicide prevention education, prior to and/or following the patient's discharge.  We were unsuccessful in providing suicide prevention education.  A suicide education pamphlet was given to the patient to share with family/significant other.  Date and time of first attempt: 01/07/14 1:00PM (message stating that person not available and to try back later. Not able to leave voicemail)  Date and time of second attempt: 01/08/14 2:57PM (message stating that person not available and to try back later. Not able to leave voicemail)   [name], Debra Amel  01/08/2014, 2:56 PM

## 2014-01-08 NOTE — Progress Notes (Signed)
Patient complaint of feeling dizzy this am. B/P elevated 169/89 sitting. 0800 dose of clonidine 0.2mg  and Hydrochlorothiazide given. Writer encouraged patient not to walk to cafeteria and tray would be brought back for her. Will report of to Primary RN this morning to follow up on B/P. Q 15 minute check continues as ordered to maintain safety.

## 2014-01-08 NOTE — BHH Group Notes (Signed)
0900 nursing orientation group    The focus of this group is to educate the patient on the purpose and policies of crisis stabilization and provide a format to answer questions about their admission.  The group details unit policies and expectations of patients while admitted.   Pt was an active participant in group and was appropriate in sharing with the group.

## 2014-01-08 NOTE — Progress Notes (Addendum)
D Pt. Denies SI and HI, no complaints of pain or discomfort noted.  A Probation officer offered support and encouragement.    R Pt. Remains safe on the unit,  Pt. Reports she has learned she cannot isolate.  Pt. States she will be more social and active with her friends after discharge.

## 2014-01-08 NOTE — Progress Notes (Signed)
Pt has been up and active in the milieu. She rated her anxiety a 3 on her self-inventory.  She denies any S/H ideation or A/V/H. Her goal today is "to go to more groups" and to get up and remain up for each group.

## 2014-01-09 LAB — CULTURE, BLOOD (ROUTINE X 2)
CULTURE: NO GROWTH
CULTURE: NO GROWTH

## 2014-01-09 MED ORDER — POLYETHYLENE GLYCOL 3350 17 G PO PACK: 17.0000 g | PACK | Freq: Two times a day (BID) | ORAL | Status: DC

## 2014-01-09 MED ORDER — FLUOXETINE HCL 20 MG PO CAPS: 20.0000 mg | ORAL_CAPSULE | Freq: Every day | ORAL | Status: DC

## 2014-01-09 MED ORDER — ARIPIPRAZOLE 5 MG PO TABS
5.0000 mg | ORAL_TABLET | Freq: Every day | ORAL | Status: DC
  Filled 2014-01-09: qty 1

## 2014-01-09 MED ORDER — PANTOPRAZOLE SODIUM 40 MG PO TBEC: 40.0000 mg | DELAYED_RELEASE_TABLET | Freq: Every day | ORAL | Status: DC

## 2014-01-09 MED ORDER — TRAZODONE HCL 50 MG PO TABS: 50.0000 mg | ORAL_TABLET | Freq: Every evening | ORAL | Status: DC | PRN

## 2014-01-09 MED ORDER — ARIPIPRAZOLE 5 MG PO TABS: 5.0000 mg | ORAL_TABLET | Freq: Every day | ORAL | Status: DC

## 2014-01-09 MED ORDER — HYDROCHLOROTHIAZIDE 25 MG PO TABS: 25.0000 mg | ORAL_TABLET | Freq: Every day | ORAL | Status: DC

## 2014-01-09 MED ORDER — AMLODIPINE BESYLATE 5 MG PO TABS: 5.0000 mg | ORAL_TABLET | Freq: Every day | ORAL | Status: DC

## 2014-01-09 MED ORDER — ATENOLOL 50 MG PO TABS: 50.0000 mg | ORAL_TABLET | Freq: Two times a day (BID) | ORAL | Status: DC

## 2014-01-09 NOTE — Progress Notes (Signed)
[facility] Adult Case Management Discharge Plan :  Will you be returning to the same living situation after discharge: Yes,  will return back to current living situation At discharge, do you have transportation home?:Yes,  bus passes given per patient request Do you have the ability to pay for your medications:Yes,  patient provided with prescriptions and has Medicaid insurance coverage to assist with cost of medications  Release of information consent forms completed and in the chart;  Patient's signature needed at discharge.  Patient to Follow up at: Follow-up Information   Follow up with [facility] On 01/16/2014. (Walk in between 8am-9am Monday through Friday for hospital follow-up/medication management/assessment for therapy services. )    Contact information:   [address], [place] [address] Phone: [phone] Fax: [phone]      Follow up with [facility] On 01/15/2014. (Appt. for therapy at 9:00AM with Verlin Grills. )    Contact information:   [facility] [address] [place], [place] [address] Phone: [phone] Fax: [phone]      Patient denies SI/HI:   Yes,  denies    Safety Planning and Suicide Prevention discussed:  Yes,  completed with patient as CSW could not contact husband. Patient provided suicide prevention education brochure. Please see SPE note for further information.  [place] Burgin, Casimiro Needle 01/09/2014, 10:34 AM

## 2014-01-09 NOTE — BHH Suicide Risk Assessment (Signed)
   Demographic Factors:  Unemployed  Total Time spent with patient: 30 minutes  Psychiatric Specialty Exam: Physical Exam  ROS  Blood pressure 140/79, pulse 88, temperature 98.6 F (37 C), temperature source Oral, resp. rate 24, height 5\' 8"  (1.727 m), weight 230 lb (104.327 kg).Body mass index is 34.98 kg/(m^2).  General Appearance: Casual  Eye Contact::  Good  Speech:  Normal Rate  Volume:  Normal  Mood:  Anxious  Affect:  Appropriate  Thought Process:  Coherent and Logical  Orientation:  Full (Time, Place, and Person)  Thought Content:  WDL  Suicidal Thoughts:  No  Homicidal Thoughts:  No  Memory:  Immediate;   Fair Recent;   Fair Remote;   Fair  Judgement:  Intact  Insight:  Fair  Psychomotor Activity:  Normal  Concentration:  Fair  Recall:  [employer] [place]  Language: Fair  Akathisia:  No  Handed:  Right  AIMS (if indicated):     Assets:  Communication Skills Desire for Improvement Housing Social Support  Sleep:  Number of Hours: 5.5    Musculoskeletal: Strength & Muscle Tone: within normal limits Gait & Station: normal Patient leans: N/A   Mental Status Per Nursing Assessment::   On Admission:     Current Mental Status by Physician: NA  Loss Factors: Financial problems/change in socioeconomic status  Historical Factors: Prior suicide attempts and Impulsivity  Risk Reduction Factors:   Sense of responsibility to family, Religious beliefs about death, Living with another person, especially a relative, Positive social support, Positive therapeutic relationship and Positive coping skills or problem solving skills  Continued Clinical Symptoms:  Bipolar Disorder:   Depressive phase Alcohol/Substance Abuse/Dependencies  Cognitive Features That Contribute To Risk:  Polarized thinking    Suicide Risk:  Minimal: No identifiable suicidal ideation.  Patients presenting with no risk factors but with morbid ruminations; may be classified as  minimal risk based on the severity of the depressive symptoms  Discharge Diagnoses:   AXIS I:  Alcohol Abuse and Bipolar, Depressed AXIS II:  Deferred AXIS III:   Past Medical History  Diagnosis Date  . Acid reflux   . Hypertension   . Bipolar disorder    AXIS IV:  other psychosocial or environmental problems and problems related to social environment AXIS V:  61-70 mild symptoms  Plan Of Care/Follow-up recommendations:  Activity:  as tolerated Diet:  un changed from past Other:  Patient will follow up with [facility]  Is patient on multiple antipsychotic therapies at discharge:  No   Has Patient had three or more failed trials of antipsychotic monotherapy by history:  No  Recommended Plan for Multiple Antipsychotic Therapies: NA    Kenslei Hearty T. 01/09/2014, 11:22 AM

## 2014-01-09 NOTE — Progress Notes (Signed)
Patient discharged per physician order; patient denies SI/HI and A/V hallucinations; patient received samples, prescriptions, bus passes and copy of AVS after it was reviewed; patient had no other questions or concerns at this time; patient verbalized and signed that she received all belongings; patient left the unit ambulatory

## 2014-01-09 NOTE — BHH Group Notes (Signed)
[facility] LCSW Aftercare Discharge Planning Group Note   01/09/2014  9:22 AM   Participation Quality: Alert, Appropriate and Oriented  Mood/Affect: Euthymic  Depression Rating: 0  Anxiety Rating: 0  Thoughts of Suicide: Pt denies SI/HI  Will you contract for safety? Yes  Current AVH: Pt denies  Plan for Discharge/Comments: Pt attended discharge planning group and actively participated in group. CSW provided pt with today's workbook. Patient reports that she feels "fine" today. She reports that she plans to follow up with [facility] at discharge. She is from [place] [employer] and can return to current living situation at discharge.   Transportation Means: Pt reports access to transportation  Supports: No supports mentioned at this time  Tilden Fossa, MSW, [name] Social Worker [employer] [phone]

## 2014-01-09 NOTE — Progress Notes (Signed)
Pt resting in bed with eyes closed. No distress noted. Will continue to monitor closely.

## 2014-01-09 NOTE — Discharge Instructions (Signed)

## 2014-01-09 NOTE — Discharge Summary (Signed)
Physician Discharge Summary Note  Patient:  Debra [name] is an 48 y.o., female MRN:  [id number] DOB:  [birth year] Patient phone:  [phone] (home)  Patient address:   [address]  [place] [address],  Total Time spent with patient: 20 minutes  Date of Admission:  01/06/2014 Date of Discharge: 01/09/2014  Reason for Admission:  Depression  Discharge Diagnoses: Active Problems:   MDD (major depressive disorder), recurrent episode, severe   Psychiatric Specialty Exam: Physical Exam  Constitutional: She is oriented to person, place, and time. She appears well-developed and well-nourished.  HENT:  Head: Normocephalic.  Neck: Normal range of motion.  Respiratory: Effort normal.  Musculoskeletal: Normal range of motion.  Neurological: She is alert and oriented to person, place, and time.    Review of Systems  Constitutional: Negative.   HENT: Negative.   Eyes: Negative.   Respiratory: Negative.   Cardiovascular: Negative.   Gastrointestinal: Negative.   Genitourinary: Negative.   Musculoskeletal: Negative.   Skin: Negative.   Neurological: Negative.   Endo/Heme/Allergies: Negative.   Psychiatric/Behavioral: Positive for depression and substance abuse.    Blood pressure 140/79, pulse 88, temperature 98.6 F (37 C), temperature source Oral, resp. rate 24, height 5\' 8"  (1.727 m), weight 230 lb (104.327 kg).Body mass index is 34.98 kg/(m^2).  General Appearance: Casual  Eye Contact::  Good  Speech:  Normal Rate  Volume:  Normal  Mood:  Euthymic  Affect:  Congruent  Thought Process:  Coherent  Orientation:  Full (Time, Place, and Person)  Thought Content:  WDL  Suicidal Thoughts:  No  Homicidal Thoughts:  No  Memory:  Immediate;   Good Recent;   Good Remote;   Good  Judgement:  Fair  Insight:  Fair  Psychomotor Activity:  Normal  Concentration:  Good  Recall:  Good  Fund of Knowledge:Good  Language: Good  Akathisia:  No  Handed:  Right   AIMS (if indicated):     Assets:  Housing Leisure Time Physical Health Resilience Social Support  Sleep:  Number of Hours: 5.5   Past Psychiatric History:  Diagnosis: Bipolar depressed, Suicidal Ideation with attempt to OD   Hospitalizations: Denies   Outpatient Care[facility] until [facility] closed   Substance Abuse Care: Denies   Self-Mutilation: Denies   Suicidal Attempts: Current plus others   Violent Behaviors: Yes, towards others but not recently per pt report     Musculoskeletal: Strength & Muscle Tone: within normal limits Gait & Station: normal Patient leans: N/A  DSM5:  Axis Diagnosis:   AXIS I:  Bipolar, Depressed AXIS II:  Deferred AXIS III:   Past Medical History  Diagnosis Date  . Acid reflux   . Hypertension   . Bipolar disorder    AXIS IV:  other psychosocial or environmental problems, problems related to social environment and problems with primary support group AXIS V:  61-70 mild symptoms  Level of Care:  OP  Hospital Course:  On admission:  48 yr old female that came in with an OD attempt. Pt consumed approx. 30 Tylenol pm and downed it with a pint of vodka. Pt stated she doesn't think she was trying to harm self, but everyone is telling her that she was. Pt has had 3 previous si attempts in the past. Pt stated she has bipolar and has been non med compliant since she cannot afford her medications. Pt support system is her family and husband. Denies si/hi/avh. Rates pain 8/10 in lower back. Meal given.  Pt introduced to unit. Pt remains safe on unit.  During hospitalization:  Medications managed--Her medical medications continued.  Her Abilify 5 mg daily for depression and Prozac 20 mg daily for depression restarted (she was not taking them because she was not able to afford them).  Trazodone 50 mg at bedtime for sleep issues started.  Amor attended and participated in individual and group therapy.  Patient denied suicidal/homicidal ideations and  auditory/visual hallucinations, follow-up appointments encouraged to attend, Rx given with 2 weeks supply of medications (psychiatric).  Debra [name] is mentally and physically stable for discharge.  Consults:  None  Significant Diagnostic Studies:  labs: completed, reviewed, stable  Discharge Vitals:   Blood pressure 140/79, pulse 88, temperature 98.6 F (37 C), temperature source Oral, resp. rate 24, height 5\' 8"  (1.727 m), weight 230 lb (104.327 kg). Body mass index is 34.98 kg/(m^2). Lab Results:   No results found for this or any previous visit (from the past 72 hour(s)).  Physical Findings: AIMS: Facial and Oral Movements Muscles of Facial Expression: None, normal Lips and Perioral Area: None, normal Jaw: None, normal Tongue: None, normal,Extremity Movements Upper (arms, wrists, hands, fingers): None, normal Lower (legs, knees, ankles, toes): None, normal, Trunk Movements Neck, shoulders, hips: None, normal, Overall Severity Severity of abnormal movements (highest score from questions above): None, normal Incapacitation due to abnormal movements: None, normal Patient's awareness of abnormal movements (rate only patient's report): No Awareness, Dental Status Current problems with teeth and/or dentures?: No Does patient usually wear dentures?: No  CIWA:  CIWA-Ar Total: 1 COWS:     Psychiatric Specialty Exam: See Psychiatric Specialty Exam and Suicide Risk Assessment completed by Attending Physician prior to discharge.  Discharge destination:  Home  Is patient on multiple antipsychotic therapies at discharge:  No   Has Patient had three or more failed trials of antipsychotic monotherapy by history:  No  Recommended Plan for Multiple Antipsychotic Therapies: NA     Medication List       Indication   amLODipine 5 MG tablet  Commonly known as:  NORVASC  Take 1 tablet (5 mg total) by mouth daily.   Indication:  High Blood Pressure     ARIPiprazole 5 MG tablet   Commonly known as:  ABILIFY  Take 1 tablet (5 mg total) by mouth at bedtime.   Indication:  Major Depressive Disorder     atenolol 50 MG tablet  Commonly known as:  TENORMIN  Take 1 tablet (50 mg total) by mouth 2 (two) times daily.   Indication:  High Blood Pressure     bisacodyl 5 MG EC tablet  Commonly known as:  DULCOLAX  Take 2 tablets (10 mg total) by mouth daily as needed for mild constipation or moderate constipation.      cloNIDine 0.2 MG tablet  Commonly known as:  CATAPRES  Take 1 tablet (0.2 mg total) by mouth 3 (three) times daily.   Indication:  High Blood Pressure     DSS 100 MG Caps  Take 100 mg by mouth 2 (two) times daily.   Indication:  Constipation     FLUoxetine 20 MG capsule  Commonly known as:  PROZAC  Take 1 capsule (20 mg total) by mouth daily.   Indication:  Depression     hydrochlorothiazide 25 MG tablet  Commonly known as:  HYDRODIURIL  Take 1 tablet (25 mg total) by mouth daily.   Indication:  Edema, High Blood Pressure     magnesium citrate Soln  Take 296 mLs (1 Bottle total) by mouth once as needed for moderate constipation or severe constipation.      MUSCLE RUB 10-15 % Crea  Apply 1 application topically as needed for muscle pain.      pantoprazole 40 MG tablet  Commonly known as:  PROTONIX  Take 1 tablet (40 mg total) by mouth daily.   Indication:  Gastroesophageal Reflux Disease     polyethylene glycol packet  Commonly known as:  MIRALAX / GLYCOLAX  Take 17 g by mouth 2 (two) times daily.   Indication:  Constipation     traZODone 50 MG tablet  Commonly known as:  DESYREL  Take 1 tablet (50 mg total) by mouth at bedtime and may repeat dose one time if needed.   Indication:  Trouble Sleeping           Follow-up Information   Follow up with [facility] On 01/16/2014. (Walk in between 8am-9am Monday through Friday for hospital follow-up/medication management/assessment for therapy services. )    Contact information:   [address], [place] [address] Phone: [phone] Fax: [phone]      Follow up with [facility] On 01/15/2014. (Appt. for therapy at 9:00AM with Verlin Grills. )    Contact information:   [facility] [place] [address] [place], [place] [address] Phone: [phone] Fax: [phone]      Follow-up recommendations:  Activity:  as tolerated Diet:  low-sodium heart healthy diet  Comments:  Patient will follow-up with [facility] and [facility] for her care after discharge.  Total Discharge Time:  Greater than 30 minutes.  SignedWaylan Boga, [place] 01/09/2014, 11:26 AM  I have personally seen the patient and agreed with the findings and involved in the treatment plan. Berniece Andreas, MD

## 2014-01-09 NOTE — Tx Team (Signed)
Interdisciplinary Treatment Plan Update (Adult)  Date: 01/09/2014  Time Reviewed:  9:45 AM  Progress in Treatment: Attending groups: Yes Participating in groups:  Yes Taking medication as prescribed:  Yes Tolerating medication:  Yes Family/Significant othe contact made: No, unable to reach pt's husband Patient understands diagnosis:  Yes Discussing patient identified problems/goals with staff:  Yes Medical problems stabilized or resolved:  Yes Denies suicidal/homicidal ideation: Yes Issues/concerns per patient self-inventory:  Yes Other:  New problem(s) identified: N/A  Discharge Plan or Barriers: Pt will follow up at [facility] for medication management and therapy.    Reason for Continuation of Hospitalization: Stable to d/c today  Comments: N/A  Estimated length of stay: D/C today  For review of initial/current patient goals, please see plan of care.  Attendees: Patient:     Family:     Physician:   01/09/2014 10:51 AM   Nursing:   Marliss Coots, RN 01/09/2014 10:51 AM   Clinical Social Worker:  Regan Lemming, LCSW 01/09/2014 10:51 AM   Other:  Marilynne Halsted, RN 01/09/2014 10:51 AM   Other:  Maxie Better, [place] 01/09/2014 10:51 AM   Other:   01/09/2014 10:51 AM   Other:     Other:    Other:    Other:    Other:    Other:    Other:     Scribe for Treatment Team:   Ane Payment, 01/09/2014 , 10:51 AM

## 2014-01-14 NOTE — Progress Notes (Signed)
Patient Discharge Instructions:  After Visit Summary (AVS):   Faxed to:  01/14/14 Discharge Summary Note:   Faxed to:  01/14/14 Psychiatric Admission Assessment Note:   Faxed to:  01/14/14 Suicide Risk Assessment - Discharge Assessment:   Faxed to:  01/14/14 Faxed/Sent to the Next Level Care provider:  01/14/14 Faxed to [facility] @ [phone] Faxed to [facility] @ [place], 01/14/2014, 3:21 PM

## 2014-02-04 ENCOUNTER — Ambulatory Visit: Payer: Self-pay | Admitting: Obstetrics & Gynecology

## 2014-04-01 ENCOUNTER — Encounter (HOSPITAL_COMMUNITY): Payer: Self-pay | Admitting: Emergency Medicine

## 2014-04-01 ENCOUNTER — Ambulatory Visit: Payer: Self-pay | Admitting: Obstetrics & Gynecology

## 2014-04-01 ENCOUNTER — Emergency Department (HOSPITAL_COMMUNITY): Payer: Self-pay

## 2014-04-01 ENCOUNTER — Emergency Department (HOSPITAL_COMMUNITY)
Admission: EM | Admit: 2014-04-01 | Discharge: 2014-04-01 | Disposition: A | Discharge status: Home / Self Care | Payer: Self-pay | Attending: Emergency Medicine | Admitting: Emergency Medicine

## 2014-04-01 DIAGNOSIS — Z8719 Personal history of other diseases of the digestive system: Secondary | ICD-10-CM | POA: Insufficient documentation

## 2014-04-01 DIAGNOSIS — I10 Essential (primary) hypertension: Secondary | ICD-10-CM | POA: Insufficient documentation

## 2014-04-01 DIAGNOSIS — J069 Acute upper respiratory infection, unspecified: Secondary | ICD-10-CM | POA: Insufficient documentation

## 2014-04-01 DIAGNOSIS — J029 Acute pharyngitis, unspecified: Secondary | ICD-10-CM | POA: Insufficient documentation

## 2014-04-01 DIAGNOSIS — R1084 Generalized abdominal pain: Secondary | ICD-10-CM | POA: Insufficient documentation

## 2014-04-01 DIAGNOSIS — R000 Tachycardia, unspecified: Secondary | ICD-10-CM | POA: Insufficient documentation

## 2014-04-01 DIAGNOSIS — Z720 Tobacco use: Secondary | ICD-10-CM | POA: Insufficient documentation

## 2014-04-01 DIAGNOSIS — A599 Trichomoniasis, unspecified: Secondary | ICD-10-CM | POA: Insufficient documentation

## 2014-04-01 DIAGNOSIS — Z8659 Personal history of other mental and behavioral disorders: Secondary | ICD-10-CM | POA: Insufficient documentation

## 2014-04-01 DIAGNOSIS — Z3202 Encounter for pregnancy test, result negative: Secondary | ICD-10-CM | POA: Insufficient documentation

## 2014-04-01 LAB — RAPID STREP SCREEN (MED CTR MEBANE ONLY): Streptococcus, Group A Screen (Direct): NEGATIVE

## 2014-04-01 LAB — URINALYSIS, ROUTINE W REFLEX MICROSCOPIC
Glucose, UA: NEGATIVE mg/dL
KETONES UR: 15 mg/dL — AB
Leukocytes, UA: NEGATIVE
Nitrite: NEGATIVE
Protein, ur: 100 mg/dL — AB
SPECIFIC GRAVITY, URINE: 1.031 — AB (ref 1.005–1.030)
UROBILINOGEN UA: 2 mg/dL — AB (ref 0.0–1.0)
pH: 5 (ref 5.0–8.0)

## 2014-04-01 LAB — I-STAT CHEM 8, ED
BUN: 12 mg/dL (ref 6–23)
CALCIUM ION: 1.19 mmol/L (ref 1.12–1.23)
CHLORIDE: 100 meq/L (ref 96–112)
Creatinine, Ser: 0.8 mg/dL (ref 0.50–1.10)
GLUCOSE: 98 mg/dL (ref 70–99)
HEMATOCRIT: 50 % — AB (ref 36.0–46.0)
Hemoglobin: 17 g/dL — ABNORMAL HIGH (ref 12.0–15.0)
Potassium: 3.8 mEq/L (ref 3.7–5.3)
Sodium: 141 mEq/L (ref 137–147)
TCO2: 28 mmol/L (ref 0–100)

## 2014-04-01 LAB — URINE MICROSCOPIC-ADD ON

## 2014-04-01 LAB — POC URINE PREG, ED: Preg Test, Ur: NEGATIVE

## 2014-04-01 MED ORDER — PANTOPRAZOLE SODIUM 20 MG PO TBEC: 20.0000 mg | DELAYED_RELEASE_TABLET | Freq: Every day | ORAL | Status: DC

## 2014-04-01 MED ORDER — HYDROCODONE-ACETAMINOPHEN 7.5-325 MG/15ML PO SOLN: 10.0000 mL | Freq: Four times a day (QID) | ORAL | Status: DC | PRN

## 2014-04-01 MED ORDER — OXYMETAZOLINE HCL 0.05 % NA SOLN
2.0000 | Freq: Once | NASAL | Status: AC
  Administered 2014-04-01: 2 via NASAL
  Filled 2014-04-01: qty 15

## 2014-04-01 MED ORDER — SODIUM CHLORIDE 0.9 % IV BOLUS (SEPSIS)
1000.0000 mL | Freq: Once | INTRAVENOUS | Status: AC
  Administered 2014-04-01: 1000 mL via INTRAVENOUS

## 2014-04-01 MED ORDER — ONDANSETRON HCL 4 MG/2ML IJ SOLN
4.0000 mg | Freq: Once | INTRAMUSCULAR | Status: AC
  Administered 2014-04-01: 4 mg via INTRAVENOUS
  Filled 2014-04-01: qty 2

## 2014-04-01 MED ORDER — LORAZEPAM 1 MG PO TABS
1.0000 mg | ORAL_TABLET | Freq: Once | ORAL | Status: AC
  Administered 2014-04-01: 1 mg via ORAL
  Filled 2014-04-01: qty 1

## 2014-04-01 MED ORDER — HYDROCODONE-ACETAMINOPHEN 7.5-325 MG/15ML PO SOLN
10.0000 mL | Freq: Once | ORAL | Status: AC
  Administered 2014-04-01: 10 mL via ORAL
  Filled 2014-04-01: qty 15

## 2014-04-01 MED ORDER — HYDROCHLOROTHIAZIDE 25 MG PO TABS
25.0000 mg | ORAL_TABLET | Freq: Every day | ORAL | Status: DC
  Administered 2014-04-01: 25 mg via ORAL
  Filled 2014-04-01: qty 1

## 2014-04-01 MED ORDER — HYDROCHLOROTHIAZIDE 25 MG PO TABS: 25.0000 mg | ORAL_TABLET | Freq: Every day | ORAL | Status: DC

## 2014-04-01 MED ORDER — METRONIDAZOLE 500 MG PO TABS
2000.0000 mg | ORAL_TABLET | Freq: Once | ORAL | Status: AC
  Administered 2014-04-01: 2000 mg via ORAL
  Filled 2014-04-01: qty 4

## 2014-04-01 MED ORDER — IBUPROFEN 800 MG PO TABS
800.0000 mg | ORAL_TABLET | Freq: Once | ORAL | Status: AC
  Administered 2014-04-01: 800 mg via ORAL
  Filled 2014-04-01: qty 1

## 2014-04-01 NOTE — ED Notes (Signed)
PA [place] at bedside  

## 2014-04-01 NOTE — ED Provider Notes (Signed)
CSN: [id number]     Arrival date & time 04/01/14  2353 History   First MD Initiated Contact with Patient 04/01/14 [phone]     Chief Complaint  Patient presents with  . Influenza     (Consider location/radiation/quality/duration/timing/severity/associated sxs/prior Treatment) HPI  Patient with hx HTN, bipolar disorder, off her medications x months, presents with nasal congestion, sore throat, bilateral ear pressure, cough productive of yellow sputum, N/V.  Denies CP, SOB, abdominal pain, urinary symptoms.  LMP 2 months ago, abnormal periods secondary to fibroids per patient.  Denies leg swelling.   States her nasal congestion and sore throat makes her anxious.   Past Medical History  Diagnosis Date  . Acid reflux   . Hypertension   . Bipolar disorder    Past Surgical History  Procedure Laterality Date  . Tonsillectomy    . Breast surgery      cyst on right breast removed.   Family History  Problem Relation Age of Onset  . Diabetes Father   . Cancer Maternal Grandmother    History  Substance Use Topics  . Smoking status: Current Every Day Smoker -- 0.50 packs/day  . Smokeless tobacco: Never Used  . Alcohol Use: No   OB History   Grav Para Term Preterm Abortions TAB SAB Ect Mult Living   0 0 0 0 0 0 0 0 0 0      Review of Systems  All other systems reviewed and are negative.     Allergies  Lisinopril  Home Medications   Prior to Admission medications   Medication Sig Start Date End Date Taking? Authorizing Provider  naproxen sodium (ANAPROX) 220 MG tablet Take 220 mg by mouth 2 (two) times daily as needed (for pain).   Yes Historical Provider, MD   BP 182/114  Pulse 113  Temp(Src) 98 F (36.7 C) (Oral)  Resp 18  Wt 220 lb (99.791 kg)  SpO2 100% Physical Exam  Nursing note and vitals reviewed. Constitutional: She appears well-developed and well-nourished. No distress.  HENT:  Head: Normocephalic and atraumatic.  Mouth/Throat: Oropharynx is clear and  moist. No oropharyngeal exudate.  Neck: Normal range of motion. Neck supple.  Cardiovascular: Normal rate and regular rhythm.   Pulmonary/Chest: Effort normal and breath sounds normal. No stridor. No respiratory distress. She has no wheezes. She has no rales.  Abdominal: Soft. She exhibits no distension. There is tenderness. There is no rebound and no guarding.  Diffuse lower abdomen tenderness, pt states is chronic, unchanged.  Lymphadenopathy:    She has no cervical adenopathy.  Neurological: She is alert.  Skin: She is not diaphoretic.  Psychiatric:  Flat affect    ED Course  Procedures (including critical care time) Labs Review Labs Reviewed  URINALYSIS, ROUTINE W REFLEX MICROSCOPIC - Abnormal; Notable for the following:    Color, Urine AMBER (*)    APPearance HAZY (*)    Specific Gravity, Urine 1.031 (*)    Hgb urine dipstick LARGE (*)    Bilirubin Urine SMALL (*)    Ketones, ur 15 (*)    Protein, ur 100 (*)    Urobilinogen, UA 2.0 (*)    All other components within normal limits  URINE MICROSCOPIC-ADD ON - Abnormal; Notable for the following:    Squamous Epithelial / LPF FEW (*)    All other components within normal limits  I-STAT CHEM 8, ED - Abnormal; Notable for the following:    Hemoglobin 17.0 (*)    HCT 50.0 (*)  All other components within normal limits  RAPID STREP SCREEN  CULTURE, GROUP A STREP  POC URINE PREG, ED    Imaging Review Dg Chest 2 View  04/01/2014   CLINICAL DATA:  Influenza  EXAM: CHEST  2 VIEW  COMPARISON:  01/02/2014  FINDINGS: The heart size and mediastinal contours are within normal limits. Both lungs are clear. The visualized skeletal structures are unremarkable.  IMPRESSION: No active cardiopulmonary disease.   Electronically Signed   By: Franchot Gallo M.D.   On: 04/01/2014 08:13     EKG Interpretation None      Filed Vitals:   04/01/14 1029  BP: 165/99  Pulse: 111  Temp:   Resp: 17     MDM   Final diagnoses:  URI  (upper respiratory infection)  Essential hypertension  Trichomonas infection    Afebrile, nontoxic patient with nasal congestion, sore throat, bilateral ear pressure, mild sore throat - constellation of symptoms suggests viral syndrome. Pt with HTN, previously on multiple medications, also normally on psych medications but has not followed up, has been off medications since August.  Denies CP, SOB.  No AMS.  Doubt hypertensive emergency.  Renal function is normal.   Pt noted having nasal congestion made her very anxious, also sore throat caused her to eat and drink less  - likely combination resulted in tachycardia, which improved with IVF.  Pt tolerating PO fluids.  As pt denied CP, SOB, O2 sat normal - doubt PE.  Doubt peritonsillar abscess. D/C home with HCTZ, hycet for sore throat, protonix (per pt request).  I have asked that she follow closely with [facility] to reestablish psych medications.  She denies significant depression and denies SI.  Discussed result, findings, treatment, and follow up  with patient.  Pt given return precautions.  Pt verbalizes understanding and agrees with plan.        Clayton Bibles, PA-C 04/01/14 1144

## 2014-04-01 NOTE — ED Notes (Signed)
Pt alert and oriented at discharge.  Pt verbalized understanding of discharge instructions.  Pt encouraged to continue to blow her nose and cough up her secretions and to follow up with her prescriptions and primary care provider.  Pt was ambulatory to the waiting room with the RN.

## 2014-04-01 NOTE — ED Provider Notes (Signed)
Medical screening examination/treatment/procedure(s) were performed by non-physician practitioner and as supervising physician I was immediately available for consultation/collaboration.   EKG Interpretation None        Evelina Bucy, MD 04/01/14 1152

## 2014-04-01 NOTE — Discharge Instructions (Signed)
Read the information below.  Use the prescribed medication as directed.  Please discuss all new medications with your pharmacist.  Do not take additional tylenol while taking the prescribed pain medication to avoid overdose.  You may return to the Emergency Department at any time for worsening condition or any new symptoms that concern you.  If you develop high fevers that do not resolve with tylenol or ibuprofen, you have difficulty swallowing or breathing, or you are unable to tolerate fluids by mouth, return to the ER for a recheck.      Upper Respiratory Infection, Adult An upper respiratory infection (URI) is also known as the common cold. It is often caused by a type of germ (virus). Colds are easily spread (contagious). You can pass it to others by kissing, coughing, sneezing, or drinking out of the same glass. Usually, you get better in 1 or 2 weeks.  HOME CARE   Only take medicine as told by your doctor.  Use a warm mist humidifier or breathe in steam from a hot shower.  Drink enough water and fluids to keep your pee (urine) clear or pale yellow.  Get plenty of rest.  Return to work when your temperature is back to normal or as told by your doctor. You may use a face mask and wash your hands to stop your cold from spreading. GET HELP RIGHT AWAY IF:   After the first few days, you feel you are getting worse.  You have questions about your medicine.  You have chills, shortness of breath, or brown or red spit (mucus).  You have yellow or brown snot (nasal discharge) or pain in the face, especially when you bend forward.  You have a fever, puffy (swollen) neck, pain when you swallow, or white spots in the back of your throat.  You have a bad headache, ear pain, sinus pain, or chest pain.  You have a high-pitched whistling sound when you breathe in and out (wheezing).  You have a lasting cough or cough up blood.  You have sore muscles or a stiff neck. MAKE SURE YOU:    Understand these instructions.  Will watch your condition.  Will get help right away if you are not doing well or get worse. Document Released: 11/22/2007 Document Revised: 08/28/2011 Document Reviewed: 09/10/2013 [facility] Patient Information 2015 [place], [place]. This information is not intended to replace advice given to you by your health care provider. Make sure you discuss any questions you have with your health care provider.  Hypertension Hypertension is another name for high blood pressure. High blood pressure forces your heart to work harder to pump blood. A blood pressure reading has two numbers, which includes a higher number over a lower number (example: 110/72). HOME CARE   Have your blood pressure rechecked by your doctor.  Only take medicine as told by your doctor. Follow the directions carefully. The medicine does not work as well if you skip doses. Skipping doses also puts you at risk for problems.  Do not smoke.  Monitor your blood pressure at home as told by your doctor. GET HELP IF:  You think you are having a reaction to the medicine you are taking.  You have repeat headaches or feel dizzy.  You have puffiness (swelling) in your ankles.  You have trouble with your vision. GET HELP RIGHT AWAY IF:   You get a very bad headache and are confused.  You feel weak, numb, or faint.  You get chest or  belly (abdominal) pain.  You throw up (vomit).  You cannot breathe very well. MAKE SURE YOU:   Understand these instructions.  Will watch your condition.  Will get help right away if you are not doing well or get worse. Document Released: 11/22/2007 Document Revised: 06/10/2013 Document Reviewed: 03/28/2013 [facility] Patient Information 2015 [place], [place]. This information is not intended to replace advice given to you by your health care provider. Make sure you discuss any questions you have with your health care provider.

## 2014-04-01 NOTE — Discharge Planning (Signed)
[facility] [place] Specialist  Spoke with patient regarding primary care resources and the [facility] orange card. 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 Appointment made for eligibility & enrollment with Family Medicine at [facility] for Friday Oct 30,2015 at 9:00am, pt verbalized understanding of this appointment. Orange card application and my contact information provided for any future questions or concerns. No other [facility] Specialist needs identified at this time.

## 2014-04-03 LAB — CULTURE, GROUP A STREP

## 2014-04-20 ENCOUNTER — Ambulatory Visit: Payer: Self-pay | Admitting: Obstetrics & Gynecology

## 2014-06-03 ENCOUNTER — Ambulatory Visit: Payer: Self-pay | Admitting: Obstetrics & Gynecology

## 2014-08-06 ENCOUNTER — Ambulatory Visit: Payer: Self-pay | Admitting: Family Medicine

## 2014-09-14 ENCOUNTER — Ambulatory Visit: Payer: Self-pay | Admitting: Family Medicine

## 2014-11-11 ENCOUNTER — Ambulatory Visit: Payer: Self-pay | Admitting: Obstetrics & Gynecology

## 2014-12-04 ENCOUNTER — Ambulatory Visit: Payer: Self-pay | Admitting: Obstetrics & Gynecology

## 2015-07-08 ENCOUNTER — Encounter: Payer: Self-pay | Admitting: Obstetrics & Gynecology

## 2015-07-08 ENCOUNTER — Ambulatory Visit (INDEPENDENT_AMBULATORY_CARE_PROVIDER_SITE_OTHER): Payer: Self-pay | Admitting: Obstetrics & Gynecology

## 2015-07-08 VITALS — BP 170/107 | HR 111 | Temp 98.2°F | Ht 66.0 in | Wt 214.8 lb

## 2015-07-08 DIAGNOSIS — D259 Leiomyoma of uterus, unspecified: Secondary | ICD-10-CM

## 2015-07-08 LAB — POCT URINALYSIS DIP (DEVICE)
BILIRUBIN URINE: NEGATIVE
GLUCOSE, UA: NEGATIVE mg/dL
Leukocytes, UA: NEGATIVE
Nitrite: NEGATIVE
Protein, ur: >=300 mg/dL — AB
Specific Gravity, Urine: 1.025 (ref 1.005–1.030)
Urobilinogen, UA: 0.2 mg/dL (ref 0.0–1.0)
pH: 5.5 (ref 5.0–8.0)

## 2015-07-08 MED ORDER — NAPROXEN 500 MG PO TABS: 500.0000 mg | ORAL_TABLET | Freq: Two times a day (BID) | ORAL | Status: DC

## 2015-07-08 NOTE — Progress Notes (Signed)
CLINIC ENCOUNTER NOTE  History:  50 y.o. G0P0000 here today for evaluation of pain and increased urination for several weeks, attributed to fibroids.  She denies any abnormal vaginal discharge, bleeding or other concerns.   Past Medical History  Diagnosis Date  . Acid reflux   . Hypertension   . Bipolar disorder [facility])     Past Surgical History  Procedure Laterality Date  . Tonsillectomy    . Breast surgery      cyst on right breast removed.    The following portions of the patient's history were reviewed and updated as appropriate: allergies, current medications, past family history, past medical history, past social history, past surgical history and problem list.   Health Maintenance:  Does not remember last pap smear. 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  Review of Systems:  Pertinent items noted in HPI and remainder of comprehensive ROS otherwise negative.  Objective:  Physical Exam BP 170/107 mmHg  Pulse 111  Temp(Src) 98.2 F (36.8 C) (Oral)  Ht 5\' 6"  (1.676 m)  Wt 214 lb 12.8 oz (97.433 kg)  BMI 34.69 kg/m2 CONSTITUTIONAL: Well-developed, well-nourished female in no acute distress.  HENT:  Normocephalic, atraumatic. External right and left ear normal. Oropharynx is clear and moist EYES: Conjunctivae and EOM are normal. Pupils are equal, round, and reactive to light. No scleral icterus.  NECK: Normal range of motion, supple, no masses SKIN: Skin is warm and dry. No rash noted. Not diaphoretic. No erythema. No pallor. [place]: Alert and oriented to person, place, and time. Normal reflexes, muscle tone coordination. No cranial nerve deficit noted. PSYCHIATRIC: Normal mood and affect. Normal behavior. Normal judgment and thought content. CARDIOVASCULAR: Normal heart rate noted RESPIRATORY: Effort and breath sounds normal, no problems with respiration noted ABDOMEN: Soft, no distention noted. 20 week size globular uterus palpated.   PELVIC: Normal  appearing external genitalia; normal appearing vaginal mucosa and cervix.  No abnormal discharge noted.  20 week globular uterus palpated, some tenderness, no other palpable masses, no uterine or adnexal tenderness. MUSCULOSKELETAL: Normal range of motion. No edema noted.  Labs and Imaging Results for orders placed or performed in visit on 07/08/15 (from the past 24 hour(s))  POCT urinalysis dip (device)     Status: Abnormal   Collection Time: 07/08/15  1:38 PM  Result Value Ref Range   Glucose, UA NEGATIVE NEGATIVE mg/dL   Bilirubin Urine NEGATIVE NEGATIVE   Ketones, ur TRACE (A) NEGATIVE mg/dL   Specific Gravity, Urine 1.025 1.005 - 1.030   Hgb urine dipstick MODERATE (A) NEGATIVE   pH 5.5 5.0 - 8.0   Protein, ur >=300 (A) NEGATIVE mg/dL   Urobilinogen, UA 0.2 0.0 - 1.0 mg/dL   Nitrite NEGATIVE NEGATIVE   Leukocytes, UA NEGATIVE NEGATIVE    12/15/2011 TRANSABDOMINAL AND TRANSVAGINAL ULTRASOUND OF PELVIS  Comparison: CT 12/15/2011  Findings:  Uterus: Marked diffuse nodular enlargement of the uterus consistent with multiple fibroids. The uterus measures 16.9 by 10.6 x 11.1 cm. A fibroid is measured in the body of the uterus at 6.8 x 8.8 x 9 cm. Another exophytic fibroid off of the anterior body is measured at 5 x 4.9 x 5.5 cm. A cervical fibroid is measured at 3.5 x 3.6 x 3 cm.  Endometrium: The endometrium is not visualized.  Right ovary: The right ovary is not visualized.  Left ovary: The left ovary is not visualized.  Other findings: No free fluid  IMPRESSION: Diffuse multinodular enlargement of the uterus consistent  with multiple fibroids. Visualization of other pelvic structures is limited due to the large fibroids. The endometrium and ovaries are not visualized.  Assessment & Plan:  1. FIBROIDS, UTERUS Symptoms are likely due to fibroids.  Will get repeat ultrasound to reevaluate.  Patient is leaning towards getting hysterectomy. Discussed fibroids  in detail with patient; also discussed hysterectomy and need for preoperative testing. - US Pelvis Complete; Future - US Transvaginal Non-OB; Future - naproxen (NAPROSYN) 500 MG tablet; Take 1 tablet (500 mg total) by mouth 2 (two) times daily with a meal.  Dispense: 60 tablet; Refill: 3. Encouraged to take for pain. Routine preventative health maintenance measures emphasized.   Mammogram scholarship application given. Please refer to After Visit Summary for other counseling recommendations.   Return in about 3 weeks (around 07/29/2015) for Surgical consult for fibroids/endometrial biopsy.   Total face-to-face time with patient: 30 minutes. Over 50% of encounter was spent on counseling and coordination of care.   Verita Schneiders, MD, [place] Attending Obstetrician & Gynecologist, [place] for [place]

## 2015-07-08 NOTE — Patient Instructions (Signed)
Uterine Fibroids Uterine fibroids are tissue masses (tumors) that can develop in the womb (uterus). They are also called leiomyomas. This type of tumor is not cancerous (benign) and does not spread to other parts of the body outside of the pelvic area, which is between the hip bones. Occasionally, fibroids may develop in the fallopian tubes, in the cervix, or on the support structures (ligaments) that surround the uterus. You can have one or many fibroids. Fibroids can vary in size, weight, and where they grow in the uterus. Some can become quite large. Most fibroids do not require medical treatment. CAUSES A fibroid can develop when a single uterine cell keeps growing (replicating). Most cells in the human body have a control mechanism that keeps them from replicating without control. SIGNS AND SYMPTOMS Symptoms may include:   Heavy bleeding during your period.  Bleeding or spotting between periods.  Pelvic pain and pressure.  Bladder problems, such as needing to urinate more often (urinary frequency) or urgently.  Inability to reproduce offspring (infertility).  Miscarriages. DIAGNOSIS Uterine fibroids are diagnosed through a physical exam. Your health care provider may feel the lumpy tumors during a pelvic exam. Ultrasonography and an MRI may be done to determine the size, location, and number of fibroids. TREATMENT Treatment may include:  Watchful waiting. This involves getting the fibroid checked by your health care provider to see if it grows or shrinks. Follow your health care provider's recommendations for how often to have this checked.  Hormone medicines. These can be taken by mouth or given through an intrauterine device (IUD).  Surgery.  Removing the fibroids (myomectomy) or the uterus (hysterectomy).  Removing blood supply to the fibroids (uterine artery embolization). If fibroids interfere with your fertility and you want to become pregnant, your health care provider  may recommend having the fibroids removed.  HOME CARE INSTRUCTIONS  Keep all follow-up visits as directed by your health care provider. This is important.  Take medicines only as directed by your health care provider.  If you were prescribed a hormone treatment, take the hormone medicines exactly as directed.  Do not take aspirin, because it can cause bleeding.  Ask your health care provider about taking iron pills and increasing the amount of dark green, leafy vegetables in your diet. These actions can help to boost your blood iron levels, which may be affected by heavy menstrual bleeding.  Pay close attention to your period and tell your health care provider about any changes, such as:  Increased blood flow that requires you to use more pads or tampons than usual per month.  A change in the number of days that your period lasts per month.  A change in symptoms that are associated with your period, such as abdominal cramping or back pain. SEEK MEDICAL CARE IF:  You have pelvic pain, back pain, or abdominal cramps that cannot be controlled with medicines.  You have an increase in bleeding between and during periods.  You soak tampons or pads in a half hour or less.  You feel lightheaded, extra tired, or weak. SEEK IMMEDIATE MEDICAL CARE IF:  You faint.  You have a sudden increase in pelvic pain.   This information is not intended to replace advice given to you by your health care provider. Make sure you discuss any questions you have with your health care provider.   Document Released: 06/02/2000 Document Revised: 06/26/2014 Document Reviewed: 12/02/2013 Elsevier Interactive Patient Education 2016 Elsevier Inc.  

## 2015-07-09 ENCOUNTER — Encounter: Payer: Self-pay | Admitting: Obstetrics & Gynecology

## 2015-07-15 ENCOUNTER — Ambulatory Visit (HOSPITAL_COMMUNITY): Admission: RE | Admit: 2015-07-15 | Payer: Self-pay | Source: Ambulatory Visit

## 2015-07-22 ENCOUNTER — Ambulatory Visit (HOSPITAL_COMMUNITY): Payer: Self-pay

## 2015-07-23 ENCOUNTER — Ambulatory Visit (HOSPITAL_COMMUNITY)
Admission: RE | Admit: 2015-07-23 | Discharge: 2015-07-23 | Disposition: A | Discharge status: Home / Self Care | Payer: Self-pay | Source: Ambulatory Visit | Attending: Obstetrics & Gynecology | Admitting: Obstetrics & Gynecology

## 2015-07-23 DIAGNOSIS — D252 Subserosal leiomyoma of uterus: Secondary | ICD-10-CM | POA: Insufficient documentation

## 2015-07-23 DIAGNOSIS — D259 Leiomyoma of uterus, unspecified: Secondary | ICD-10-CM

## 2015-07-26 ENCOUNTER — Telehealth: Payer: Self-pay

## 2015-07-26 NOTE — Telephone Encounter (Signed)
Patient has been informed of results and scheduled for appt.

## 2015-08-02 ENCOUNTER — Emergency Department (HOSPITAL_COMMUNITY)
Admission: EM | Admit: 2015-08-02 | Discharge: 2015-08-02 | Disposition: A | Discharge status: Home / Self Care | Payer: Self-pay | Attending: Emergency Medicine | Admitting: Emergency Medicine

## 2015-08-02 ENCOUNTER — Encounter (HOSPITAL_COMMUNITY): Payer: Self-pay | Admitting: *Deleted

## 2015-08-02 ENCOUNTER — Emergency Department (HOSPITAL_COMMUNITY): Payer: Self-pay

## 2015-08-02 DIAGNOSIS — Z8659 Personal history of other mental and behavioral disorders: Secondary | ICD-10-CM | POA: Insufficient documentation

## 2015-08-02 DIAGNOSIS — Y9389 Activity, other specified: Secondary | ICD-10-CM | POA: Insufficient documentation

## 2015-08-02 DIAGNOSIS — Y9289 Other specified places as the place of occurrence of the external cause: Secondary | ICD-10-CM | POA: Insufficient documentation

## 2015-08-02 DIAGNOSIS — W010XXA Fall on same level from slipping, tripping and stumbling without subsequent striking against object, initial encounter: Secondary | ICD-10-CM | POA: Insufficient documentation

## 2015-08-02 DIAGNOSIS — I10 Essential (primary) hypertension: Secondary | ICD-10-CM | POA: Insufficient documentation

## 2015-08-02 DIAGNOSIS — K219 Gastro-esophageal reflux disease without esophagitis: Secondary | ICD-10-CM | POA: Insufficient documentation

## 2015-08-02 DIAGNOSIS — Y998 Other external cause status: Secondary | ICD-10-CM | POA: Insufficient documentation

## 2015-08-02 DIAGNOSIS — S8391XA Sprain of unspecified site of right knee, initial encounter: Secondary | ICD-10-CM | POA: Insufficient documentation

## 2015-08-02 DIAGNOSIS — Z79899 Other long term (current) drug therapy: Secondary | ICD-10-CM | POA: Insufficient documentation

## 2015-08-02 DIAGNOSIS — F17200 Nicotine dependence, unspecified, uncomplicated: Secondary | ICD-10-CM | POA: Insufficient documentation

## 2015-08-02 DIAGNOSIS — D259 Leiomyoma of uterus, unspecified: Secondary | ICD-10-CM

## 2015-08-02 LAB — BASIC METABOLIC PANEL
Anion gap: 8 (ref 5–15)
BUN: 13 mg/dL (ref 6–20)
CHLORIDE: 107 mmol/L (ref 101–111)
CO2: 26 mmol/L (ref 22–32)
CREATININE: 0.62 mg/dL (ref 0.44–1.00)
Calcium: 9 mg/dL (ref 8.9–10.3)
Glucose, Bld: 103 mg/dL — ABNORMAL HIGH (ref 65–99)
POTASSIUM: 4.2 mmol/L (ref 3.5–5.1)
SODIUM: 141 mmol/L (ref 135–145)

## 2015-08-02 LAB — CBC WITH DIFFERENTIAL/PLATELET
BASOS ABS: 0 10*3/uL (ref 0.0–0.1)
Basophils Relative: 1 %
EOS ABS: 0.3 10*3/uL (ref 0.0–0.7)
EOS PCT: 7 %
HCT: 39.3 % (ref 36.0–46.0)
Hemoglobin: 12.8 g/dL (ref 12.0–15.0)
LYMPHS PCT: 34 %
Lymphs Abs: 1.4 10*3/uL (ref 0.7–4.0)
MCH: 30.6 pg (ref 26.0–34.0)
MCHC: 32.6 g/dL (ref 30.0–36.0)
MCV: 94 fL (ref 78.0–100.0)
MONO ABS: 0.5 10*3/uL (ref 0.1–1.0)
Monocytes Relative: 13 %
Neutro Abs: 1.8 10*3/uL (ref 1.7–7.7)
Neutrophils Relative %: 45 %
PLATELETS: 220 10*3/uL (ref 150–400)
RBC: 4.18 MIL/uL (ref 3.87–5.11)
RDW: 13.2 % (ref 11.5–15.5)
WBC: 4.1 10*3/uL (ref 4.0–10.5)

## 2015-08-02 MED ORDER — IBUPROFEN 200 MG PO TABS
600.0000 mg | ORAL_TABLET | Freq: Once | ORAL | Status: AC
  Administered 2015-08-02: 600 mg via ORAL
  Filled 2015-08-02: qty 3

## 2015-08-02 MED ORDER — NAPROXEN 500 MG PO TABS: 500.0000 mg | ORAL_TABLET | Freq: Two times a day (BID) | ORAL | Status: DC

## 2015-08-02 MED ORDER — ATENOLOL 50 MG PO TABS: 50.0000 mg | ORAL_TABLET | Freq: Every day | ORAL | Status: DC

## 2015-08-02 MED ORDER — HYDROCHLOROTHIAZIDE 25 MG PO TABS: 25.0000 mg | ORAL_TABLET | Freq: Every day | ORAL | Status: DC

## 2015-08-02 MED ORDER — HYDROCHLOROTHIAZIDE 25 MG PO TABS
25.0000 mg | ORAL_TABLET | Freq: Every day | ORAL | Status: DC
  Administered 2015-08-02: 25 mg via ORAL
  Filled 2015-08-02: qty 1

## 2015-08-02 NOTE — ED Notes (Signed)
Pt reports fall last week, was going down the hill and slipped.  States she heard her R knee "pop."  Pt is ambulatory without difficulty.  Pt also reports h/a.  Pt's BP is elevated.  Has hx of HTN and has not taken her med since November.  Advised pt in contacting the health dept. Or the [facility].

## 2015-08-02 NOTE — ED Provider Notes (Signed)
CSN: [id number]     Arrival date & time 08/02/15  0734 History   First MD Initiated Contact with Patient 08/02/15 0804     Chief Complaint  Patient presents with  . Fall  . Knee Injury  . Headache     (Consider location/radiation/quality/duration/timing/severity/associated sxs/prior Treatment) HPI Comments: Pt comes in with cc of knee pain. Pt has hx of HTN, she has run out of her prescription and hasnt been taking her meds. Pt had a fall last week. She felt a knee pop. She has been able to ambulate, but with a limp since then. Swelling has subsided. Pain not improving, so she came to the ER. BP is noted to be elevated. Pt has a mild headache right now, No nausea, vomiting, visual complains, seizures, altered mental status, loss of consciousness, new weakness, or numbness, no gait instability, no chest pain.    Patient is a 50 y.o. female presenting with fall and headaches.  Fall Pertinent negatives include no abdominal pain and no headaches.  Headache Associated symptoms: no abdominal pain and no vomiting     Past Medical History  Diagnosis Date  . Acid reflux   . Hypertension   . Bipolar disorder [facility])    Past Surgical History  Procedure Laterality Date  . Tonsillectomy    . Breast surgery      cyst on right breast removed.   Family History  Problem Relation Age of Onset  . Diabetes Father   . Cancer Maternal Grandmother    Social History  Substance Use Topics  . Smoking status: Current Every Day Smoker -- 0.50 packs/day  . Smokeless tobacco: Never Used  . Alcohol Use: No   OB History    Gravida Para Term Preterm AB TAB SAB Ectopic Multiple Living   0 0 0 0 0 0 0 0 0 0      Review of Systems  Constitutional: Positive for activity change.  Gastrointestinal: Negative for vomiting and abdominal pain.  Musculoskeletal: Positive for arthralgias and gait problem.  Neurological: Negative for headaches.  Hematological: Does not bruise/bleed easily.       Allergies  Lisinopril  Home Medications   Prior to Admission medications   Medication Sig Start Date End Date Taking? Authorizing Provider  Ibuprofen-Diphenhydramine HCl (ADVIL PM) 200-25 MG CAPS Take 2 tablets by mouth at bedtime as needed (sleep/pain).   Yes Historical Provider, MD  pantoprazole (PROTONIX) 20 MG tablet Take 1 tablet (20 mg total) by mouth daily. 04/01/14  Yes Clayton Bibles, PA-C  atenolol (TENORMIN) 50 MG tablet Take 1 tablet (50 mg total) by mouth daily. 08/02/15   Varney Biles, MD  hydrochlorothiazide (HYDRODIURIL) 25 MG tablet Take 1 tablet (25 mg total) by mouth daily. 08/02/15   Varney Biles, MD  HYDROcodone-acetaminophen (HYCET) 7.5-325 mg/15 ml solution Take 10 mLs by mouth 4 (four) times daily as needed for moderate pain or severe pain. Patient not taking: Reported on 07/08/2015 04/01/14   Clayton Bibles, PA-C  naproxen (NAPROSYN) 500 MG tablet Take 1 tablet (500 mg total) by mouth 2 (two) times daily with a meal. 08/02/15   Keshon Markovitz, MD   BP 177/113 mmHg  Pulse 101  Temp(Src) 98.4 F (36.9 C) (Oral)  Resp 18  SpO2 100%  LMP 07/24/2012 Physical Exam  Constitutional: She is oriented to person, place, and time. She appears well-developed.  HENT:  Head: Normocephalic and atraumatic.  Eyes: EOM are normal.  Neck: Normal range of motion. Neck supple.  Cardiovascular: Normal rate.  Pulmonary/Chest: Effort normal.  Abdominal: Bowel sounds are normal.  Musculoskeletal: She exhibits tenderness. She exhibits no edema.  Medial knee tenderness, but otherwise stable joint - R knee  Neurological: She is alert and oriented to person, place, and time.  Skin: Skin is warm and dry.  Nursing note and vitals reviewed.   ED Course  Procedures (including critical care time) Labs Review Labs Reviewed  BASIC METABOLIC PANEL - Abnormal; Notable for the following:    Glucose, Bld 103 (*)    All other components within normal limits  CBC WITH  DIFFERENTIAL/PLATELET    Imaging Review Dg Knee Complete 4 Views Right  08/02/2015  CLINICAL DATA:  Status post fall 1 week ago. The patient felt a pop in her right knee at the time of the fall. Right knee pain and stiffness. Initial encounter. EXAM: RIGHT KNEE - COMPLETE 4+ VIEW COMPARISON:  None. FINDINGS: There is no evidence of fracture, dislocation, or joint effusion. There is no evidence of arthropathy or other focal bone abnormality. Mild spurring at the quadriceps tendon insertion is noted. Soft tissues are unremarkable. IMPRESSION: Negative exam. Electronically Signed   By: Inge Rise M.D.   On: 08/02/2015 08:18   I have personally reviewed and evaluated these images and lab results as part of my medical decision-making.   EKG Interpretation None      MDM   Final diagnoses:  Essential hypertension  Knee sprain, right, initial encounter    Pt comes in with cc of fall. Knee xrays ordered and are neg. Likely soft tissue/ligamentous injury.     Varney Biles, MD 08/02/15 [phone]

## 2015-08-02 NOTE — Discharge Instructions (Signed)
Take the BP meds and pain meds as prescribed. See Orthopedics. Ice the knee 2-3 times a day for 10 min.   Knee Sprain A knee sprain is a tear in one of the strong, fibrous tissues that connect the bones (ligaments) in your knee. The severity of the sprain depends on how much of the ligament is torn. The tear can be either partial or complete. CAUSES  Often, sprains are a result of a fall or injury. The force of the impact causes the fibers of your ligament to stretch too much. This excess tension causes the fibers of your ligament to tear. SIGNS AND SYMPTOMS  You may have some loss of motion in your knee. Other symptoms include:  Bruising.  Pain in the knee area.  Tenderness of the knee to the touch.  Swelling. DIAGNOSIS  To diagnose a knee sprain, your health care provider will physically examine your knee. Your health care provider may also suggest an X-ray exam of your knee to make sure no bones are broken. TREATMENT  If your ligament is only partially torn, treatment usually involves keeping the knee in a fixed position (immobilization) or bracing your knee for activities that require movement for several weeks. To do this, your health care provider will apply a bandage, cast, or splint to keep your knee from moving and to support your knee during movement until it heals. For a partially torn ligament, the healing process usually takes 4-6 weeks. If your ligament is completely torn, depending on which ligament it is, you may need surgery to reconnect the ligament to the bone or reconstruct it. After surgery, a cast or splint may be applied and will need to stay on your knee for 4-6 weeks while your ligament heals. HOME CARE INSTRUCTIONS  Keep your injured knee elevated to decrease swelling.  To ease pain and swelling, apply ice to the injured area:  Put ice in a plastic bag.  Place a towel between your skin and the bag.  Leave the ice on for 20 minutes, 2-3 times a day.  Only  take medicine for pain as directed by your health care provider.  Do not leave your knee unprotected until pain and stiffness go away (usually 4-6 weeks).  If you have a cast or splint, do not allow it to get wet. If you have been instructed not to remove it, cover it with a plastic bag when you shower or bathe. Do not swim.  Your health care provider may suggest exercises for you to do during your recovery to prevent or limit permanent weakness and stiffness. SEEK IMMEDIATE MEDICAL CARE IF:  Your cast or splint becomes damaged.  Your pain becomes worse.  You have significant pain, swelling, or numbness below the cast or splint. MAKE SURE YOU:  Understand these instructions.  Will watch your condition.  Will get help right away if you are not doing well or get worse.   This information is not intended to replace advice given to you by your health care provider. Make sure you discuss any questions you have with your health care provider.   Document Released: 06/05/2005 Document Revised: 06/26/2014 Document Reviewed: 01/15/2013 Elsevier Interactive Patient Education 2016 [employer]. Hypertension Hypertension, commonly called high blood pressure, is when the force of blood pumping through your arteries is too strong. Your arteries are the blood vessels that carry blood from your heart throughout your body. A blood pressure reading consists of a higher number over a lower number, such  as 110/72. The higher number (systolic) is the pressure inside your arteries when your heart pumps. The lower number (diastolic) is the pressure inside your arteries when your heart relaxes. Ideally you want your blood pressure below 120/80. Hypertension forces your heart to work harder to pump blood. Your arteries may become narrow or stiff. Having untreated or uncontrolled hypertension can cause heart attack, stroke, kidney disease, and other problems. RISK FACTORS Some risk factors for high blood  pressure are controllable. Others are not.  Risk factors you cannot control include:   Race. You may be at higher risk if you are African American.  Age. Risk increases with age.  Gender. Men are at higher risk than women before age 42 years. After age 27, women are at higher risk than men. Risk factors you can control include:  Not getting enough exercise or physical activity.  Being overweight.  Getting too much fat, sugar, calories, or salt in your diet.  Drinking too much alcohol. SIGNS AND SYMPTOMS Hypertension does not usually cause signs or symptoms. Extremely high blood pressure (hypertensive crisis) may cause headache, anxiety, shortness of breath, and nosebleed. DIAGNOSIS To check if you have hypertension, your health care provider will measure your blood pressure while you are seated, with your arm held at the level of your heart. It should be measured at least twice using the same arm. Certain conditions can cause a difference in blood pressure between your right and left arms. A blood pressure reading that is higher than normal on one occasion does not mean that you need treatment. If it is not clear whether you have high blood pressure, you may be asked to return on a different day to have your blood pressure checked again. Or, you may be asked to monitor your blood pressure at home for 1 or more weeks. TREATMENT Treating high blood pressure includes making lifestyle changes and possibly taking medicine. Living a healthy lifestyle can help lower high blood pressure. You may need to change some of your habits. Lifestyle changes may include:  Following the DASH diet. This diet is high in fruits, vegetables, and whole grains. It is low in salt, red meat, and added sugars.  Keep your sodium intake below 2,300 mg per day.  Getting at least 30-45 minutes of aerobic exercise at least 4 times per week.  Losing weight if necessary.  Not smoking.  Limiting alcoholic  beverages.  Learning ways to reduce stress. Your health care provider may prescribe medicine if lifestyle changes are not enough to get your blood pressure under control, and if one of the following is true:  You are 37-69 years of age and your systolic blood pressure is above 140.  You are 41 years of age or older, and your systolic blood pressure is above 150.  Your diastolic blood pressure is above 90.  You have diabetes, and your systolic blood pressure is over [id number] or your diastolic blood pressure is over 90.  You have kidney disease and your blood pressure is above 140/90.  You have heart disease and your blood pressure is above 140/90. Your personal target blood pressure may vary depending on your medical conditions, your age, and other factors. HOME CARE INSTRUCTIONS  Have your blood pressure rechecked as directed by your health care provider.   Take medicines only as directed by your health care provider. Follow the directions carefully. Blood pressure medicines must be taken as prescribed. The medicine does not work as well when you skip doses.  Skipping doses also puts you at risk for problems.  Do not smoke.   Monitor your blood pressure at home as directed by your health care provider. SEEK MEDICAL CARE IF:   You think you are having a reaction to medicines taken.  You have recurrent headaches or feel dizzy.  You have swelling in your ankles.  You have trouble with your vision. SEEK IMMEDIATE MEDICAL CARE IF:  You develop a severe headache or confusion.  You have unusual weakness, numbness, or feel faint.  You have severe chest or abdominal pain.  You vomit repeatedly.  You have trouble breathing. MAKE SURE YOU:   Understand these instructions.  Will watch your condition.  Will get help right away if you are not doing well or get worse.   This information is not intended to replace advice given to you by your health care provider. Make sure you  discuss any questions you have with your health care provider.   Document Released: 06/05/2005 Document Revised: 10/20/2014 Document Reviewed: 03/28/2013 Elsevier Interactive Patient Education [employer].

## 2015-08-03 ENCOUNTER — Other Ambulatory Visit (HOSPITAL_COMMUNITY): Payer: Self-pay | Admitting: *Deleted

## 2015-08-03 DIAGNOSIS — N644 Mastodynia: Secondary | ICD-10-CM

## 2015-08-04 ENCOUNTER — Telehealth (HOSPITAL_COMMUNITY): Payer: Self-pay | Admitting: *Deleted

## 2015-08-04 NOTE — Telephone Encounter (Signed)
Telephoned patient at home # and voicemail box not set up.  

## 2015-08-05 ENCOUNTER — Ambulatory Visit (HOSPITAL_COMMUNITY): Payer: Self-pay

## 2015-08-13 ENCOUNTER — Encounter (HOSPITAL_COMMUNITY): Payer: Self-pay

## 2015-08-13 ENCOUNTER — Ambulatory Visit: Payer: Self-pay

## 2015-08-13 ENCOUNTER — Ambulatory Visit (HOSPITAL_COMMUNITY)
Admission: RE | Admit: 2015-08-13 | Discharge: 2015-08-13 | Disposition: A | Discharge status: Home / Self Care | Payer: Self-pay | Source: Ambulatory Visit | Attending: Obstetrics and Gynecology | Admitting: Obstetrics and Gynecology

## 2015-08-13 VITALS — BP 158/92 | Temp 98.2°F | Ht 69.0 in | Wt 221.0 lb

## 2015-08-13 DIAGNOSIS — Z1239 Encounter for other screening for malignant neoplasm of breast: Secondary | ICD-10-CM

## 2015-08-13 DIAGNOSIS — N644 Mastodynia: Secondary | ICD-10-CM

## 2015-08-13 NOTE — Progress Notes (Signed)
Complaints of right breast pain that comes and goes. Patient rates pain at a 6 out of 10.  Pap Smear: Pap smear not completed today. Last Pap smear was 08/02/2015 at the free cervical cancer screening at the [facility] and patient hasn't received results. Told patient will call or send her a letter with results after we receive. Per patient has no history of an abnormal Pap smear. Most recent Pap smear is not in EPIC. Will scan most recent Pap smear in EPIC when receive. Previous Pap smear on 12/14/2006 is in EPIC.  Physical exam: Breasts Left breast larger than right breast due to history of right breast lumpectomy for fibroadenoma. No skin abnormalities bilateral breasts. No nipple retraction bilateral breasts. No nipple discharge bilateral breasts. No lymphadenopathy. No lumps palpated bilateral breasts. Complaints of right breast tenderness between 6-8 o'clock. Referred patient to the [place] for diagnostic mammogram. Appointment scheduled for Friday, August 13, 2015 at 1310.  Pelvic/Bimanual No Pap smear completed today since last Pap smear was 08/02/2015. Pap smear not indicated per BCCCP guidelines.   Smoking History: Patient currently smokes. Discussed smoking cessation with patient. Referred patient to the [facility] Quitline and gave resources to free classes offered at the [facility].  Patient Navigation: Patient education provided. Access to services provided for patient through [facility] program.

## 2015-08-13 NOTE — Patient Instructions (Addendum)
Educational materials on breast self awareness given. Explained to Debra [name] that she did not need a Pap smear today due to last Pap smear was 08/02/2015. Let her know [facility] will cover Pap smears every 3 years unless has a history of abnormal Pap smears. Patient stated she is getting a hysterectomy this year for benign reasons. Let her know that she will not need any further Pap smears after she has her hysterectomy if her most recent Pap smear is normal. Referred patient to the [facility] for diagnostic mammogram. Appointment scheduled for Friday, August 13, 2015 at 1310. Patient aware of appointment and will be there. Let patient know will follow up with her within the next couple weeks with result of most recent Pap smear by letter or phone. Discussed smoking cessation with patient. Referred patient to the [facility] and gave resources to free classes offered at the [facility]. [name] verbalized understanding.  Aleida Crandell, Arvil Chaco, RN 11:05 AM

## 2015-08-23 ENCOUNTER — Encounter (HOSPITAL_COMMUNITY): Payer: Self-pay | Admitting: *Deleted

## 2015-08-23 ENCOUNTER — Telehealth (HOSPITAL_COMMUNITY): Payer: Self-pay | Admitting: *Deleted

## 2015-08-23 NOTE — Telephone Encounter (Signed)
Telephoned patient at home # and discussed negative pap smear results. Advised patient next pap due in 3 years. Patient voiced understanding. Scanned pap results in epic.

## 2015-08-26 ENCOUNTER — Ambulatory Visit
Admission: RE | Admit: 2015-08-26 | Discharge: 2015-08-26 | Disposition: A | Discharge status: Home / Self Care | Payer: No Typology Code available for payment source | Source: Ambulatory Visit | Attending: Obstetrics and Gynecology | Admitting: Obstetrics and Gynecology

## 2015-08-26 ENCOUNTER — Other Ambulatory Visit (HOSPITAL_COMMUNITY): Payer: Self-pay | Admitting: Obstetrics and Gynecology

## 2015-08-26 DIAGNOSIS — N644 Mastodynia: Secondary | ICD-10-CM

## 2015-10-13 ENCOUNTER — Other Ambulatory Visit: Payer: Self-pay | Admitting: Obstetrics & Gynecology

## 2015-11-11 ENCOUNTER — Other Ambulatory Visit: Payer: Self-pay | Admitting: Obstetrics & Gynecology

## 2015-12-05 ENCOUNTER — Encounter (HOSPITAL_COMMUNITY): Payer: Self-pay | Admitting: Emergency Medicine

## 2015-12-05 ENCOUNTER — Emergency Department (HOSPITAL_COMMUNITY)
Admission: EM | Admit: 2015-12-05 | Discharge: 2015-12-05 | Disposition: A | Discharge status: Home / Self Care | Payer: No Typology Code available for payment source | Attending: Emergency Medicine | Admitting: Emergency Medicine

## 2015-12-05 ENCOUNTER — Emergency Department (HOSPITAL_COMMUNITY): Payer: No Typology Code available for payment source

## 2015-12-05 DIAGNOSIS — I10 Essential (primary) hypertension: Secondary | ICD-10-CM | POA: Insufficient documentation

## 2015-12-05 DIAGNOSIS — Y92410 Unspecified street and highway as the place of occurrence of the external cause: Secondary | ICD-10-CM | POA: Insufficient documentation

## 2015-12-05 DIAGNOSIS — M25561 Pain in right knee: Secondary | ICD-10-CM

## 2015-12-05 DIAGNOSIS — R079 Chest pain, unspecified: Secondary | ICD-10-CM | POA: Insufficient documentation

## 2015-12-05 DIAGNOSIS — F319 Bipolar disorder, unspecified: Secondary | ICD-10-CM | POA: Diagnosis not present

## 2015-12-05 DIAGNOSIS — M542 Cervicalgia: Secondary | ICD-10-CM | POA: Diagnosis not present

## 2015-12-05 DIAGNOSIS — Y999 Unspecified external cause status: Secondary | ICD-10-CM | POA: Insufficient documentation

## 2015-12-05 DIAGNOSIS — Z79899 Other long term (current) drug therapy: Secondary | ICD-10-CM | POA: Insufficient documentation

## 2015-12-05 DIAGNOSIS — M79601 Pain in right arm: Secondary | ICD-10-CM | POA: Insufficient documentation

## 2015-12-05 DIAGNOSIS — Y939 Activity, unspecified: Secondary | ICD-10-CM | POA: Insufficient documentation

## 2015-12-05 DIAGNOSIS — F17200 Nicotine dependence, unspecified, uncomplicated: Secondary | ICD-10-CM | POA: Insufficient documentation

## 2015-12-05 DIAGNOSIS — Z791 Long term (current) use of non-steroidal anti-inflammatories (NSAID): Secondary | ICD-10-CM | POA: Insufficient documentation

## 2015-12-05 MED ORDER — METHOCARBAMOL 500 MG PO TABS: 500.0000 mg | ORAL_TABLET | Freq: Two times a day (BID) | ORAL | Status: DC

## 2015-12-05 MED ORDER — ATENOLOL 50 MG PO TABS: 50.0000 mg | ORAL_TABLET | Freq: Every day | ORAL | Status: DC

## 2015-12-05 MED ORDER — HYDROCHLOROTHIAZIDE 25 MG PO TABS: 25.0000 mg | ORAL_TABLET | Freq: Every day | ORAL | Status: DC

## 2015-12-05 MED ORDER — IBUPROFEN 800 MG PO TABS
800.0000 mg | ORAL_TABLET | Freq: Once | ORAL | Status: AC
  Administered 2015-12-05: 800 mg via ORAL
  Filled 2015-12-05: qty 1

## 2015-12-05 MED ORDER — PANTOPRAZOLE SODIUM 20 MG PO TBEC: 20.0000 mg | DELAYED_RELEASE_TABLET | Freq: Every day | ORAL | Status: DC

## 2015-12-05 MED ORDER — IBUPROFEN 800 MG PO TABS: 800.0000 mg | ORAL_TABLET | Freq: Three times a day (TID) | ORAL | Status: DC
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
## 2015-12-05 NOTE — ED Notes (Signed)
Pt is requesting to meet with PA about prescriptions. Pt ambulated in room  Approx.30 steps. C/o r/knee and hip pain

## 2015-12-13 ENCOUNTER — Other Ambulatory Visit: Payer: Self-pay | Admitting: Obstetrics & Gynecology

## 2016-03-17 ENCOUNTER — Other Ambulatory Visit: Payer: Self-pay | Admitting: Obstetrics and Gynecology

## 2016-03-17 DIAGNOSIS — N644 Mastodynia: Secondary | ICD-10-CM

## 2016-03-27 ENCOUNTER — Other Ambulatory Visit: Payer: Self-pay

## 2016-05-01 ENCOUNTER — Encounter: Payer: Self-pay | Admitting: Obstetrics & Gynecology

## 2016-05-25 ENCOUNTER — Encounter: Payer: Self-pay | Admitting: Obstetrics & Gynecology

## 2016-06-08 ENCOUNTER — Other Ambulatory Visit: Payer: Self-pay

## 2016-07-10 ENCOUNTER — Encounter: Payer: Self-pay | Admitting: Obstetrics & Gynecology

## 2016-07-27 ENCOUNTER — Ambulatory Visit: Payer: Self-pay | Admitting: Obstetrics & Gynecology

## 2016-07-27 ENCOUNTER — Encounter: Payer: Self-pay | Admitting: Obstetrics & Gynecology

## 2016-12-01 ENCOUNTER — Ambulatory Visit: Payer: Self-pay | Admitting: Internal Medicine

## 2017-01-16 ENCOUNTER — Ambulatory Visit (INDEPENDENT_AMBULATORY_CARE_PROVIDER_SITE_OTHER): Payer: Self-pay | Admitting: Physician Assistant

## 2017-02-23 ENCOUNTER — Ambulatory Visit (INDEPENDENT_AMBULATORY_CARE_PROVIDER_SITE_OTHER): Payer: Self-pay | Admitting: Physician Assistant

## 2017-02-23 ENCOUNTER — Encounter (INDEPENDENT_AMBULATORY_CARE_PROVIDER_SITE_OTHER): Payer: Self-pay | Admitting: Physician Assistant

## 2017-02-23 VITALS — BP 186/94 | HR 125 | Temp 98.2°F | Resp 18 | Ht 69.0 in | Wt 221.0 lb

## 2017-02-23 DIAGNOSIS — E042 Nontoxic multinodular goiter: Secondary | ICD-10-CM

## 2017-02-23 DIAGNOSIS — I10 Essential (primary) hypertension: Secondary | ICD-10-CM

## 2017-02-23 DIAGNOSIS — F3131 Bipolar disorder, current episode depressed, mild: Secondary | ICD-10-CM

## 2017-02-23 DIAGNOSIS — L299 Pruritus, unspecified: Secondary | ICD-10-CM

## 2017-02-23 DIAGNOSIS — F411 Generalized anxiety disorder: Secondary | ICD-10-CM

## 2017-02-23 MED ORDER — CLONAZEPAM 0.5 MG PO TABS: 0.5000 mg | ORAL_TABLET | Freq: Every day | ORAL | 0 refills | Status: DC

## 2017-02-23 MED ORDER — AMLODIPINE BESYLATE 5 MG PO TABS: 5.0000 mg | ORAL_TABLET | Freq: Every day | ORAL | 1 refills | Status: DC

## 2017-02-23 MED ORDER — HYDROCHLOROTHIAZIDE 25 MG PO TABS
25.0000 mg | ORAL_TABLET | Freq: Every day | ORAL | 1 refills | Status: DC
Start: 2017-02-23 — End: 2017-04-05

## 2017-02-23 MED ORDER — RISPERIDONE 0.5 MG PO TABS
0.5000 mg | ORAL_TABLET | Freq: Every day | ORAL | 2 refills | Status: DC
Start: 2017-02-23 — End: 2017-04-05

## 2017-02-23 MED ORDER — ATENOLOL 50 MG PO TABS: 50.0000 mg | ORAL_TABLET | Freq: Every day | ORAL | 1 refills | Status: DC

## 2017-02-23 NOTE — Patient Instructions (Signed)

## 2017-02-23 NOTE — Progress Notes (Signed)
Subjective:  Patient ID: Debra [name], female    DOB: Dec 26, 1965  Age: 51 y.o. MRN: [id number]  CC: new patient  HPI Debra [name] is a 51 y.o. female with a medical history of acid reflux, bipolar disorder, HTN, bipolar disorder, depression, and anxiety presents as a new patient to address HTN, bipolar disorder, and thyroid disease. She has been out of medications for "many months".     Main concern is of uncontrolled anxiety. She is constantly worrying and is now difficult for her to ride in a vehicle because of her anxiety. Also has bipolar depression which she says is manifested in the unwillingness to leave her home. No suicidal ideation or intent.    Blood pressure is not well controlled due to lack of medications. However, patient reports seeing high blood pressure even when she did take her anti-hypertensives. Denies CP, SOB, HA, f/c/n/v, abdominal pain, tingling, numbness, weakness, swelling, or GI/GU sxs.     Has been itching all over body with no attributable cause. Has multiple hyperpigmented postinflammatory lesions throughtout body. Denies insect bites or bed bugs.      Outpatient Medications Prior to Visit  Medication Sig Dispense Refill  . atenolol (TENORMIN) 50 MG tablet Take 1 tablet (50 mg total) by mouth daily. 30 tablet 0  . ibuprofen (ADVIL,MOTRIN) 800 MG tablet Take 1 tablet (800 mg total) by mouth 3 (three) times daily. 21 tablet 0  . Ibuprofen-Diphenhydramine HCl (ADVIL PM) 200-25 MG CAPS Take 2 tablets by mouth at bedtime as needed (sleep/pain).    . methocarbamol (ROBAXIN) 500 MG tablet Take 1 tablet (500 mg total) by mouth 2 (two) times daily. 10 tablet 0  . naproxen (NAPROSYN) 500 MG tablet Take 1 tablet (500 mg total) by mouth 2 (two) times daily with a meal. 60 tablet 3  . pantoprazole (PROTONIX) 20 MG tablet Take 1 tablet (20 mg total) by mouth daily. 30 tablet 0  . hydrochlorothiazide (HYDRODIURIL) 25 MG tablet Take 1 tablet (25  mg total) by mouth daily. 30 tablet 0  . atenolol (TENORMIN) 50 MG tablet Take 1 tablet (50 mg total) by mouth daily. 30 tablet 0  . hydrochlorothiazide (HYDRODIURIL) 25 MG tablet Take 1 tablet (25 mg total) by mouth daily. 30 tablet 0  . HYDROcodone-acetaminophen (HYCET) 7.5-325 mg/15 ml solution Take 10 mLs by mouth 4 (four) times daily as needed for moderate pain or severe pain. (Patient not taking: Reported on 07/08/2015) 150 mL 0  . pantoprazole (PROTONIX) 20 MG tablet Take 1 tablet (20 mg total) by mouth daily. 30 tablet 0   No facility-administered medications prior to visit.      ROS Review of Systems  Constitutional: Negative for chills, fever and malaise/fatigue.  Eyes: Negative for blurred vision.  Respiratory: Negative for shortness of breath.   Cardiovascular: Negative for chest pain and palpitations.  Gastrointestinal: Negative for abdominal pain and nausea.  Genitourinary: Negative for dysuria and hematuria.  Musculoskeletal: Negative for joint pain and myalgias.  Skin: Positive for itching. Negative for rash.  Neurological: Negative for tingling and headaches.  Psychiatric/Behavioral: Positive for depression. The patient is nervous/anxious.     Objective:  BP (!) 186/94 (BP Location: Left Arm, Patient Position: Sitting, Cuff Size: Large)   Pulse (!) 125   Temp 98.2 F (36.8 C) (Oral)   Resp 18   Ht 5\' 9"  (1.753 m)   Wt 221 lb (100.2 kg)   LMP 07/24/2012   SpO2 97%   BMI 32.64  kg/m   BP/Weight 02/23/2017 12/05/2015 2/53/6644  Systolic BP 034 742 595  Diastolic BP 94 638 92  Wt. (Lbs) 221 - 221  BMI 32.64 - 32.62  Some encounter information is confidential and restricted. Go to Review Flowsheets activity to see all data.      Physical Exam  Constitutional: She is oriented to person, place, and time.  Well developed, overweight, NAD, polite  HENT:  Head: Normocephalic and atraumatic.  Eyes: No scleral icterus.  Neck: Normal range of motion. Neck supple.  No thyromegaly present.  Cardiovascular: Normal rate, regular rhythm and normal heart sounds.   No LE edema bilaterally  Pulmonary/Chest: Effort normal and breath sounds normal.  Musculoskeletal: She exhibits no edema.  Neurological: She is alert and oriented to person, place, and time. No cranial nerve deficit. Coordination normal.  Skin: Skin is warm and dry. No rash noted. No erythema. No pallor.  Multiple hyperpigmented postinflammatory lesions on arms and upper chest.   Psychiatric: Thought content normal.  Anxious mood, fidgety, difficulty sitting still, wide eyed. Thoughts linear, appropriate response, speech normal rate, judgement poor, insight poor.  Vitals reviewed.    Assessment & Plan:    1. HYPERTENSION, BENIGN ESSENTIAL - Begin hydrochlorothiazide (HYDRODIURIL) 25 MG tablet; Take 1 tablet (25 mg total) by mouth daily.  Dispense: 30 tablet; Refill: 1 - Begin atenolol (TENORMIN) 50 MG tablet; Take 1 tablet (50 mg total) by mouth daily.  Dispense: 30 tablet; Refill: 1 - Begin amLODipine (NORVASC) 5 MG tablet; Take 1 tablet (5 mg total) by mouth daily.  Dispense: 30 tablet; Refill: 1 - Comprehensive metabolic panel - CBC with Differential  2. Bipolar affective disorder, currently depressed, mild (HCC) - Begin risperiDONE (RISPERDAL) 0.5 MG tablet; Take 1 tablet (0.5 mg total) by mouth at bedtime.  Dispense: 30 tablet; Refill: 2. No sexual intercourse in four years, no pregnancy. - Begin clonazePAM (KLONOPIN) 0.5 MG tablet; Take 1 tablet (0.5 mg total) by mouth at bedtime.  Dispense: 30 tablet; Refill: 0  3. GOITER, MULTINODULAR - Thyroid Panel With TSH  4. Pruritus - Suspected psychogenic but will wait on LFTs for now.   Meds ordered this encounter  Medications  . hydrochlorothiazide (HYDRODIURIL) 25 MG tablet    Sig: Take 1 tablet (25 mg total) by mouth daily.    Dispense:  30 tablet    Refill:  1    Order Specific Question:   Supervising Provider    Answer:    Tresa Garter [id number]  . atenolol (TENORMIN) 50 MG tablet    Sig: Take 1 tablet (50 mg total) by mouth daily.    Dispense:  30 tablet    Refill:  1    Order Specific Question:   Supervising Provider    Answer:   Tresa Garter [id number]  . risperiDONE (RISPERDAL) 0.5 MG tablet    Sig: Take 1 tablet (0.5 mg total) by mouth at bedtime.    Dispense:  30 tablet    Refill:  2    Order Specific Question:   Supervising Provider    Answer:   Tresa Garter [id number]  . amLODipine (NORVASC) 5 MG tablet    Sig: Take 1 tablet (5 mg total) by mouth daily.    Dispense:  30 tablet    Refill:  1    Order Specific Question:   Supervising Provider    Answer:   Tresa Garter [id number]  . clonazePAM (KLONOPIN) 0.5 MG tablet    Sig:  Take 1 tablet (0.5 mg total) by mouth at bedtime.    Dispense:  30 tablet    Refill:  0    Order Specific Question:   Supervising Provider    Answer:   Tresa Garter [id number]    Follow-up: Return in about 4 weeks (around 03/23/2017) for HTN.   Clent Demark PA

## 2017-02-24 LAB — THYROID PANEL WITH TSH
Free Thyroxine Index: 5.5 — ABNORMAL HIGH (ref 1.2–4.9)
T3 Uptake Ratio: 32 % (ref 24–39)
T4, Total: 17.3 ug/dL — ABNORMAL HIGH (ref 4.5–12.0)
TSH: <0.006 u[IU]/mL — ABNORMAL LOW (ref 0.450–4.500)

## 2017-02-24 LAB — CBC WITH DIFFERENTIAL/PLATELET
BASOS ABS: 0 10*3/uL (ref 0.0–0.2)
BASOS: 0 %
EOS (ABSOLUTE): 0.3 10*3/uL (ref 0.0–0.4)
Eos: 5 %
HEMOGLOBIN: 13.5 g/dL (ref 11.1–15.9)
Hematocrit: 39.9 % (ref 34.0–46.6)
IMMATURE GRANS (ABS): 0 10*3/uL (ref 0.0–0.1)
Immature Granulocytes: 0 %
LYMPHS ABS: 1.6 10*3/uL (ref 0.7–3.1)
Lymphs: 29 %
MCH: 31.6 pg (ref 26.6–33.0)
MCHC: 33.8 g/dL (ref 31.5–35.7)
MCV: 93 fL (ref 79–97)
Monocytes Absolute: 0.7 10*3/uL (ref 0.1–0.9)
Monocytes: 12 %
NEUTROS ABS: 3 10*3/uL (ref 1.4–7.0)
Neutrophils: 54 %
PLATELETS: 212 10*3/uL (ref 150–379)
RBC: 4.27 x10E6/uL (ref 3.77–5.28)
RDW: 13.2 % (ref 12.3–15.4)
WBC: 5.6 10*3/uL (ref 3.4–10.8)

## 2017-02-24 LAB — COMPREHENSIVE METABOLIC PANEL
A/G RATIO: 1.3 (ref 1.2–2.2)
ALBUMIN: 4.1 g/dL (ref 3.5–5.5)
ALT: 59 IU/L — ABNORMAL HIGH (ref 0–32)
AST: 59 IU/L — ABNORMAL HIGH (ref 0–40)
Alkaline Phosphatase: 180 IU/L — ABNORMAL HIGH (ref 39–117)
BUN/Creatinine Ratio: 20 (ref 9–23)
BUN: 24 mg/dL (ref 6–24)
Bilirubin Total: 0.4 mg/dL (ref 0.0–1.2)
CALCIUM: 9.8 mg/dL (ref 8.7–10.2)
CO2: 24 mmol/L (ref 20–29)
Chloride: 103 mmol/L (ref 96–106)
Creatinine, Ser: 1.21 mg/dL — ABNORMAL HIGH (ref 0.57–1.00)
GFR, EST AFRICAN AMERICAN: 61 mL/min/{1.73_m2} (ref >59)
GFR, EST NON AFRICAN AMERICAN: 53 mL/min/{1.73_m2} — AB (ref >59)
GLOBULIN, TOTAL: 3.2 g/dL (ref 1.5–4.5)
Glucose: 84 mg/dL (ref 65–99)
POTASSIUM: 4.3 mmol/L (ref 3.5–5.2)
SODIUM: 144 mmol/L (ref 134–144)
TOTAL PROTEIN: 7.3 g/dL (ref 6.0–8.5)

## 2017-02-26 ENCOUNTER — Other Ambulatory Visit (INDEPENDENT_AMBULATORY_CARE_PROVIDER_SITE_OTHER): Payer: Self-pay | Admitting: *Deleted

## 2017-02-26 ENCOUNTER — Other Ambulatory Visit (INDEPENDENT_AMBULATORY_CARE_PROVIDER_SITE_OTHER): Payer: Self-pay | Admitting: Physician Assistant

## 2017-02-26 DIAGNOSIS — E0590 Thyrotoxicosis, unspecified without thyrotoxic crisis or storm: Secondary | ICD-10-CM

## 2017-02-26 MED ORDER — PANTOPRAZOLE SODIUM 20 MG PO TBEC: 20.0000 mg | DELAYED_RELEASE_TABLET | Freq: Every day | ORAL | 0 refills | Status: DC

## 2017-02-26 MED ORDER — METHIMAZOLE 10 MG PO TABS
10.0000 mg | ORAL_TABLET | Freq: Three times a day (TID) | ORAL | 2 refills | Status: DC
Start: 2017-02-26 — End: 2017-04-05

## 2017-02-26 NOTE — Telephone Encounter (Signed)
Patient called requesting a refill of pantoprazole (PROTONIX) 20 MG  She use [facility] at [employer]  . Please, let her know when is ready  Thank You

## 2017-02-26 NOTE — Telephone Encounter (Signed)
Is this refill appropriate?  

## 2017-02-26 NOTE — Telephone Encounter (Signed)
-----   Message from Clent Demark, PA-C sent at 02/26/2017  8:44 AM EDT ----- Hyperthyroidism, renal filtration is slightly decreased, liver enzymes mildly elevated. I would like for patient to keep her f/u appt on 03/28/17 to discuss further testing. I have sent a medication for her thyroid to [facility].

## 2017-02-26 NOTE — Telephone Encounter (Signed)
MA unable to leave a voice message due to the system being full. Patients Protonix has been refilled.  !!!Please inform patient of hyperthyroidism and kidney function has decreased slightly. Patients liver enzymes are also elevated. Patient is advised to keep follow up appointment in one month for further testing and discuss results further. Patient has thyroid medication at the [facility] which needs to be picked up ASAP!!!

## 2017-03-08 ENCOUNTER — Telehealth (INDEPENDENT_AMBULATORY_CARE_PROVIDER_SITE_OTHER): Payer: Self-pay | Admitting: Physician Assistant

## 2017-03-08 ENCOUNTER — Ambulatory Visit (INDEPENDENT_AMBULATORY_CARE_PROVIDER_SITE_OTHER): Payer: Self-pay | Admitting: Physician Assistant

## 2017-03-08 NOTE — Telephone Encounter (Signed)
Pt called to request the lab result, please call Pt back,

## 2017-03-28 ENCOUNTER — Ambulatory Visit (INDEPENDENT_AMBULATORY_CARE_PROVIDER_SITE_OTHER): Payer: Self-pay | Admitting: Physician Assistant

## 2017-04-05 ENCOUNTER — Ambulatory Visit (INDEPENDENT_AMBULATORY_CARE_PROVIDER_SITE_OTHER): Payer: Self-pay | Admitting: Physician Assistant

## 2017-04-05 ENCOUNTER — Encounter (INDEPENDENT_AMBULATORY_CARE_PROVIDER_SITE_OTHER): Payer: Self-pay | Admitting: Physician Assistant

## 2017-04-05 VITALS — BP 180/89 | HR 113 | Temp 98.5°F | Wt 219.6 lb

## 2017-04-05 DIAGNOSIS — Z23 Encounter for immunization: Secondary | ICD-10-CM

## 2017-04-05 DIAGNOSIS — E0590 Thyrotoxicosis, unspecified without thyrotoxic crisis or storm: Secondary | ICD-10-CM

## 2017-04-05 DIAGNOSIS — I10 Essential (primary) hypertension: Secondary | ICD-10-CM

## 2017-04-05 DIAGNOSIS — F3131 Bipolar disorder, current episode depressed, mild: Secondary | ICD-10-CM

## 2017-04-05 MED ORDER — AMLODIPINE BESYLATE 5 MG PO TABS: 5.0000 mg | ORAL_TABLET | Freq: Every day | ORAL | 3 refills | Status: DC

## 2017-04-05 MED ORDER — METHIMAZOLE 10 MG PO TABS: 10.0000 mg | ORAL_TABLET | Freq: Every day | ORAL | 2 refills | Status: AC

## 2017-04-05 MED ORDER — ATENOLOL 50 MG PO TABS: 50.0000 mg | ORAL_TABLET | Freq: Every day | ORAL | 3 refills | Status: DC

## 2017-04-05 MED ORDER — RISPERIDONE 0.5 MG PO TABS: 0.5000 mg | ORAL_TABLET | Freq: Every day | ORAL | 3 refills | Status: DC

## 2017-04-05 MED ORDER — HYDROCHLOROTHIAZIDE 25 MG PO TABS: 25.0000 mg | ORAL_TABLET | Freq: Every day | ORAL | 3 refills | Status: DC

## 2017-04-05 NOTE — Patient Instructions (Signed)
Hyperthyroidism Hyperthyroidism is when the thyroid is too active (overactive). Your thyroid is a large gland that is located in your neck. The thyroid helps to control how your body uses food (metabolism). When your thyroid is overactive, it produces too much of a hormone called thyroxine. What are the causes? Causes of hyperthyroidism may include:  Graves disease. This is when your immune system attacks the thyroid gland. This is the most common cause.  Inflammation of the thyroid gland.  Tumor in the thyroid gland or somewhere else.  Excessive use of thyroid medicines, including: ? Prescription thyroid supplement. ? Herbal supplements that mimic thyroid hormones.  Solid or fluid-filled lumps within your thyroid gland (thyroid nodules).  Excessive ingestion of iodine.  What increases the risk?  Being female.  Having a family history of thyroid conditions. What are the signs or symptoms? Signs and symptoms of hyperthyroidism may include:  Nervousness.  Inability to tolerate heat.  Unexplained weight loss.  Diarrhea.  Change in the texture of hair or skin.  Heart skipping beats or making extra beats.  Rapid heart rate.  Loss of menstruation.  Shaky hands.  Fatigue.  Restlessness.  Increased appetite.  Sleep problems.  Enlarged thyroid gland or nodules.  How is this diagnosed? Diagnosis of hyperthyroidism may include:  Medical history and physical exam.  Blood tests.  Ultrasound tests.  How is this treated? Treatment may include:  Medicines to control your thyroid.  Surgery to remove your thyroid.  Radiation therapy.  Follow these instructions at home:  Take medicines only as directed by your health care provider.  Do not use any tobacco products, including cigarettes, chewing tobacco, or electronic cigarettes. If you need help quitting, ask your health care provider.  Do not exercise or do physical activity until your health care provider  approves.  Keep all follow-up appointments as directed by your health care provider. This is important. Contact a health care provider if:  Your symptoms do not get better with treatment.  You have fever.  You are taking thyroid replacement medicine and you: ? Have depression. ? Feel mentally and physically slow. ? Have weight gain. Get help right away if:  You have decreased alertness or a change in your awareness.  You have abdominal pain.  You feel dizzy.  You have a rapid heartbeat.  You have an irregular heartbeat. This information is not intended to replace advice given to you by your health care provider. Make sure you discuss any questions you have with your health care provider. Document Released: 06/05/2005 Document Revised: 11/04/2015 Document Reviewed: 10/21/2013 Elsevier Interactive Patient Education  2017 Elsevier Inc.  

## 2017-04-05 NOTE — Progress Notes (Signed)
Subjective:  Patient ID: Debra [name], female    DOB: August 28, 1965  Age: 51 y.o. MRN: [id number]  CC: f/u HTN  HPI  Debra [name] is a 51 y.o. female with a medical history of acid reflux, bipolar disorder, HTN, bipolar disorder, depression, and anxiety presents to f/u on HTN. Has been noticing low 130s/70s at home. She saw her bottles labeled "1 refill" but did not know she could pick up refill at pharmacy. She has since run out and BP is currently elevated.     Thyroid testing showed elevated T4 and suppressed TSH. Did not receive the message about picking up methimazole. Says her mother and sister have both had a thyroidectomy.     Outpatient Medications Prior to Visit  Medication Sig Dispense Refill  . amLODipine (NORVASC) 5 MG tablet Take 1 tablet (5 mg total) by mouth daily. (Patient not taking: Reported on 04/05/2017) 30 tablet 1  . atenolol (TENORMIN) 50 MG tablet Take 1 tablet (50 mg total) by mouth daily. (Patient not taking: Reported on 04/05/2017) 30 tablet 1  . clonazePAM (KLONOPIN) 0.5 MG tablet Take 1 tablet (0.5 mg total) by mouth at bedtime. (Patient not taking: Reported on 04/05/2017) 30 tablet 0  . hydrochlorothiazide (HYDRODIURIL) 25 MG tablet Take 1 tablet (25 mg total) by mouth daily. (Patient not taking: Reported on 04/05/2017) 30 tablet 1  . ibuprofen (ADVIL,MOTRIN) 800 MG tablet Take 1 tablet (800 mg total) by mouth 3 (three) times daily. (Patient not taking: Reported on 04/05/2017) 21 tablet 0  . Ibuprofen-Diphenhydramine HCl (ADVIL PM) 200-25 MG CAPS Take 2 tablets by mouth at bedtime as needed (sleep/pain).    . methimazole (TAPAZOLE) 10 MG tablet Take 1 tablet (10 mg total) by mouth 3 (three) times daily. (Patient not taking: Reported on 04/05/2017) 90 tablet 2  . methocarbamol (ROBAXIN) 500 MG tablet Take 1 tablet (500 mg total) by mouth 2 (two) times daily. (Patient not taking: Reported on 04/05/2017) 10 tablet 0  . naproxen  (NAPROSYN) 500 MG tablet Take 1 tablet (500 mg total) by mouth 2 (two) times daily with a meal. (Patient not taking: Reported on 04/05/2017) 60 tablet 3  . pantoprazole (PROTONIX) 20 MG tablet Take 1 tablet (20 mg total) by mouth daily. (Patient not taking: Reported on 04/05/2017) 30 tablet 0  . risperiDONE (RISPERDAL) 0.5 MG tablet Take 1 tablet (0.5 mg total) by mouth at bedtime. (Patient not taking: Reported on 04/05/2017) 30 tablet 2   No facility-administered medications prior to visit.      ROS Review of Systems  Constitutional: Negative for chills, fever and malaise/fatigue.  Eyes: Negative for blurred vision.  Respiratory: Negative for shortness of breath.   Cardiovascular: Negative for chest pain and palpitations.  Gastrointestinal: Negative for abdominal pain and nausea.  Genitourinary: Negative for dysuria and hematuria.  Musculoskeletal: Negative for joint pain and myalgias.  Skin: Negative for rash.  Neurological: Negative for tingling and headaches.  Psychiatric/Behavioral: Negative for depression. The patient is not nervous/anxious.     Objective:  BP (!) 180/89 (BP Location: Left Arm, Patient Position: Sitting, Cuff Size: Large)   Pulse (!) 113   Temp 98.5 F (36.9 C) (Oral)   Wt 219 lb 9.6 oz (99.6 kg)   LMP 07/24/2012   SpO2 95%   BMI 32.43 kg/m   BP/Weight 04/05/2017 02/23/2017 1/96/2229  Systolic BP 798 921 194  Diastolic BP 89 94 174  Wt. (Lbs) 219.6 221 -  BMI 32.43 32.64 -  Some encounter information is confidential and restricted. Go to Review Flowsheets activity to see all data.      Physical Exam  Constitutional: She is oriented to person, place, and time.  Well developed, overweight, NAD, polite  HENT:  Head: Normocephalic and atraumatic.  Eyes: No scleral icterus.  Neck: Normal range of motion. Neck supple. No thyromegaly present.  Cardiovascular: Regular rhythm and normal heart sounds.   tachycardic  Pulmonary/Chest: Effort normal and  breath sounds normal.  Musculoskeletal: She exhibits no edema.  Lymphadenopathy:    She has no cervical adenopathy.  Neurological: She is alert and oriented to person, place, and time. No cranial nerve deficit. Coordination normal.  Skin: Skin is warm and dry.  Psychiatric: She has a normal mood and affect. Her behavior is normal. Thought content normal.  Vitals reviewed.    Assessment & Plan:      1. HYPERTENSION, BENIGN ESSENTIAL - Refill amLODipine (NORVASC) 5 MG tablet; Take 1 tablet (5 mg total) by mouth daily.  Dispense: 90 tablet; Refill: 3 - Refill atenolol (TENORMIN) 50 MG tablet; Take 1 tablet (50 mg total) by mouth daily.  Dispense: 90 tablet; Refill: 3 - Refill hydrochlorothiazide (HYDRODIURIL) 25 MG tablet; Take 1 tablet (25 mg total) by mouth daily.  Dispense: 90 tablet; Refill: 3 - Comprehensive metabolic panel  2. Hyperthyroidism - Ambulatory referral to Endocrinology - NM RAI Thyroid Ca W/ Thyrogen; Future - Begin methimazole (TAPAZOLE) 10 MG tablet; Take 1 tablet (10 mg total) by mouth daily.  Dispense: 90 tablet; Refill: 2  3. Bipolar affective disorder, currently depressed, mild (HCC) - Refill risperiDONE (RISPERDAL) 0.5 MG tablet; Take 1 tablet (0.5 mg total) by mouth at bedtime.  Dispense: 90 tablet; Refill: 3  4. Need for Tdap vaccination - Tdap vaccine greater than or equal to 7yo IM  5. Need for prophylactic vaccination and inoculation against influenza - Flu Vaccine QUAD 6+ mos PF IM (Fluarix Quad PF)   Meds ordered this encounter  Medications  . amLODipine (NORVASC) 5 MG tablet    Sig: Take 1 tablet (5 mg total) by mouth daily.    Dispense:  90 tablet    Refill:  3    Order Specific Question:   Supervising Provider    Answer:   Tresa Garter [id number]  . atenolol (TENORMIN) 50 MG tablet    Sig: Take 1 tablet (50 mg total) by mouth daily.    Dispense:  90 tablet    Refill:  3    Order Specific Question:   Supervising Provider     Answer:   Tresa Garter [id number]  . hydrochlorothiazide (HYDRODIURIL) 25 MG tablet    Sig: Take 1 tablet (25 mg total) by mouth daily.    Dispense:  90 tablet    Refill:  3    Order Specific Question:   Supervising Provider    Answer:   Tresa Garter [id number]  . risperiDONE (RISPERDAL) 0.5 MG tablet    Sig: Take 1 tablet (0.5 mg total) by mouth at bedtime.    Dispense:  90 tablet    Refill:  3    Order Specific Question:   Supervising Provider    Answer:   Tresa Garter [id number]  . methimazole (TAPAZOLE) 10 MG tablet    Sig: Take 1 tablet (10 mg total) by mouth daily.    Dispense:  90 tablet    Refill:  2    Order Specific Question:   Supervising Provider  AnswerTresa Garter [[id number]]    Follow-up: Return in about 4 weeks (around 05/03/2017) for HTN.   Clent Demark PA

## 2017-04-06 ENCOUNTER — Telehealth: Payer: Self-pay

## 2017-04-06 LAB — COMPREHENSIVE METABOLIC PANEL
ALBUMIN: 3.9 g/dL (ref 3.5–5.5)
ALT: 46 IU/L — ABNORMAL HIGH (ref 0–32)
AST: 50 IU/L — ABNORMAL HIGH (ref 0–40)
Albumin/Globulin Ratio: 1.2 (ref 1.2–2.2)
Alkaline Phosphatase: 175 IU/L — ABNORMAL HIGH (ref 39–117)
BUN / CREAT RATIO: 16 (ref 9–23)
BUN: 16 mg/dL (ref 6–24)
Bilirubin Total: 0.4 mg/dL (ref 0.0–1.2)
CALCIUM: 10 mg/dL (ref 8.7–10.2)
CO2: 26 mmol/L (ref 20–29)
CREATININE: 1 mg/dL (ref 0.57–1.00)
Chloride: 103 mmol/L (ref 96–106)
GFR, EST AFRICAN AMERICAN: 77 mL/min/{1.73_m2} (ref >59)
GFR, EST NON AFRICAN AMERICAN: 67 mL/min/{1.73_m2} (ref >59)
Globulin, Total: 3.2 g/dL (ref 1.5–4.5)
Glucose: 107 mg/dL — ABNORMAL HIGH (ref 65–99)
Potassium: 4.4 mmol/L (ref 3.5–5.2)
Sodium: 144 mmol/L (ref 134–144)
TOTAL PROTEIN: 7.1 g/dL (ref 6.0–8.5)

## 2017-04-06 NOTE — Telephone Encounter (Signed)
-----   Message from Clent Demark, PA-C sent at 04/06/2017  9:53 AM EDT ----- Liver enzymes continue to be elevated. Return for work up at her next scheduled visit.

## 2017-04-06 NOTE — Telephone Encounter (Signed)
Patient aware of continued elevated liver enzymes, and at further work up at next appointment. Nat Christen, CMA

## 2017-04-11 ENCOUNTER — Telehealth (INDEPENDENT_AMBULATORY_CARE_PROVIDER_SITE_OTHER): Payer: Self-pay | Admitting: Physician Assistant

## 2017-04-11 NOTE — Telephone Encounter (Signed)
Patient requesting a refill of her Protonix  She use [facility] at [employer]

## 2017-04-11 NOTE — Telephone Encounter (Signed)
Patient aware that she will need to schedule appointment to discuss acid reflux. Nat Christen, CMA

## 2017-04-25 ENCOUNTER — Telehealth (INDEPENDENT_AMBULATORY_CARE_PROVIDER_SITE_OTHER): Payer: Self-pay | Admitting: Physician Assistant

## 2017-04-25 NOTE — Telephone Encounter (Signed)
Patient need to reschedule her appointment with Radiology . She called them and they said that her pcp office need to reschedule and she need her appointment for next Wednesday and Thursday prefer morning Thank you

## 2017-04-26 ENCOUNTER — Encounter (HOSPITAL_COMMUNITY): Payer: No Typology Code available for payment source

## 2017-04-27 ENCOUNTER — Encounter (HOSPITAL_COMMUNITY): Payer: No Typology Code available for payment source

## 2017-05-03 ENCOUNTER — Ambulatory Visit (INDEPENDENT_AMBULATORY_CARE_PROVIDER_SITE_OTHER): Payer: Self-pay | Admitting: Physician Assistant

## 2017-05-14 ENCOUNTER — Ambulatory Visit (HOSPITAL_COMMUNITY): Payer: No Typology Code available for payment source

## 2017-05-15 ENCOUNTER — Encounter (HOSPITAL_COMMUNITY): Payer: No Typology Code available for payment source

## 2017-05-17 ENCOUNTER — Ambulatory Visit: Payer: Self-pay | Admitting: Obstetrics & Gynecology

## 2017-05-17 NOTE — Progress Notes (Signed)
Per Dr. Harolyn Rutherford, pt does need to be contacted for reschedule.

## 2017-05-18 ENCOUNTER — Ambulatory Visit (INDEPENDENT_AMBULATORY_CARE_PROVIDER_SITE_OTHER): Payer: Self-pay | Admitting: Physician Assistant

## 2017-05-23 ENCOUNTER — Encounter (HOSPITAL_COMMUNITY): Payer: No Typology Code available for payment source

## 2017-05-24 ENCOUNTER — Encounter (HOSPITAL_COMMUNITY): Payer: No Typology Code available for payment source

## 2017-05-29 IMAGING — US US PELVIS COMPLETE
1 series · 15 of 25 positions shown · non-contrast
Comparison: 12/15/2011

CLINICAL DATA: Uterine fibroids

EXAM:
TRANSABDOMINAL AND TRANSVAGINAL ULTRASOUND OF PELVIS
TECHNIQUE: Both transabdominal and transvaginal ultrasound examinations of the
pelvis were performed. Transabdominal technique was performed for
global imaging of the pelvis including uterus, ovaries, adnexal
regions, and pelvic cul-de-sac. It was necessary to proceed with
endovaginal exam following the transabdominal exam to visualize the
right adnexa.

[Series 1: us pelvis complete · 50 acquisitions, 15 frames shown]
[im 1/50]
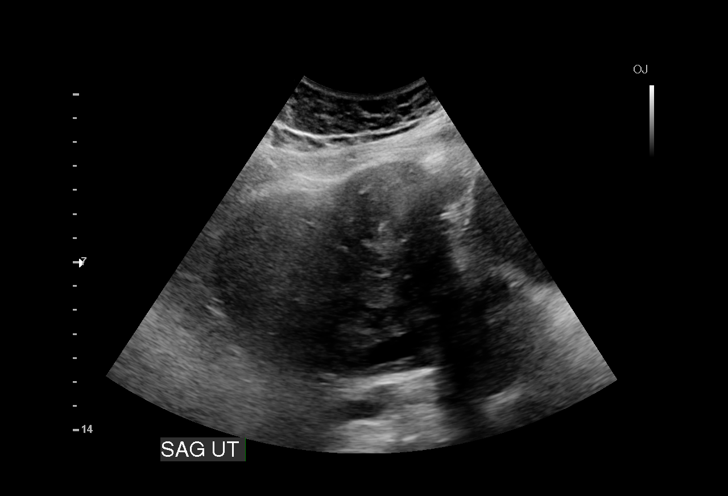
[im 5/50]
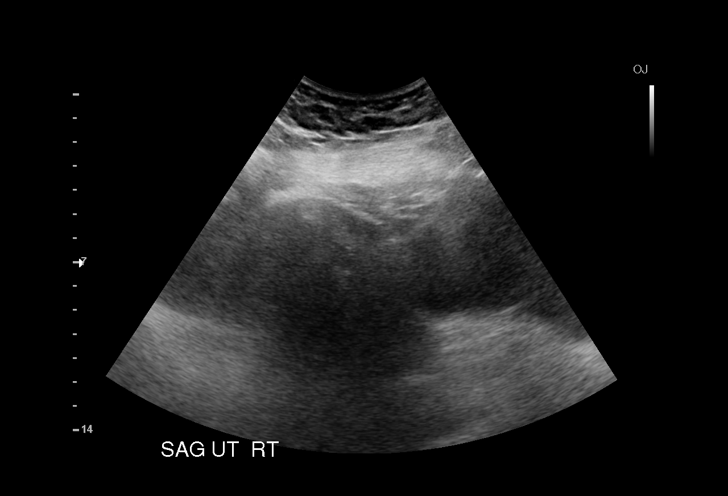
[im 9/50]
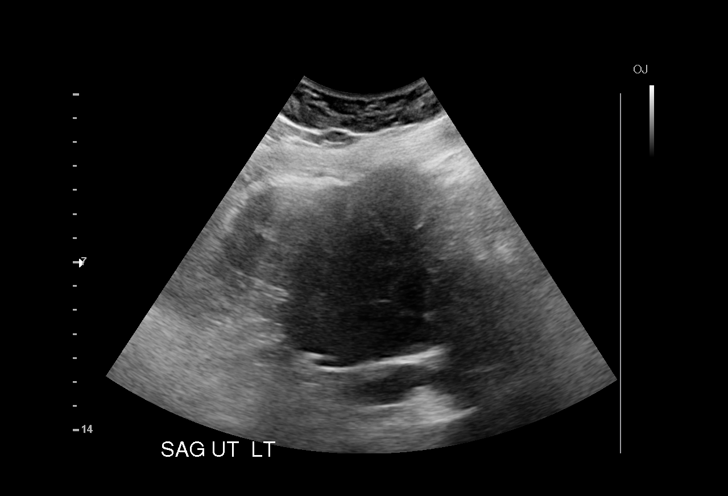
[im 11/50]
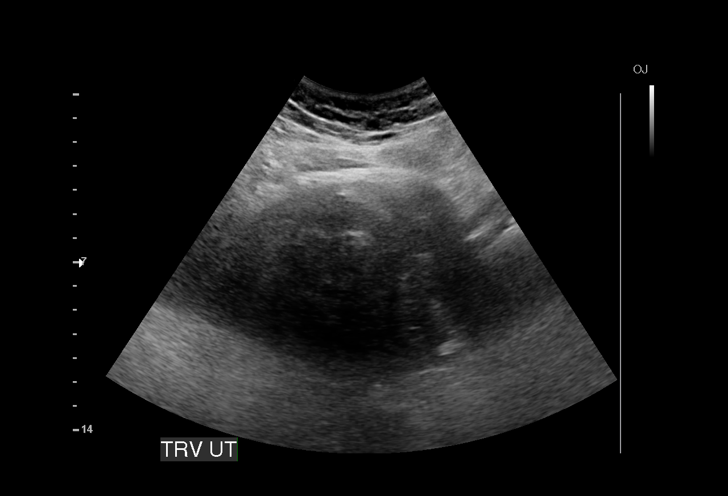
[im 15/50]
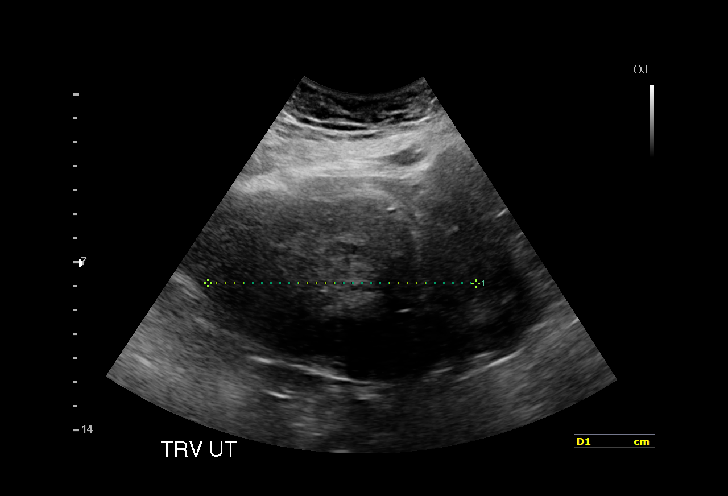
[im 19/50]
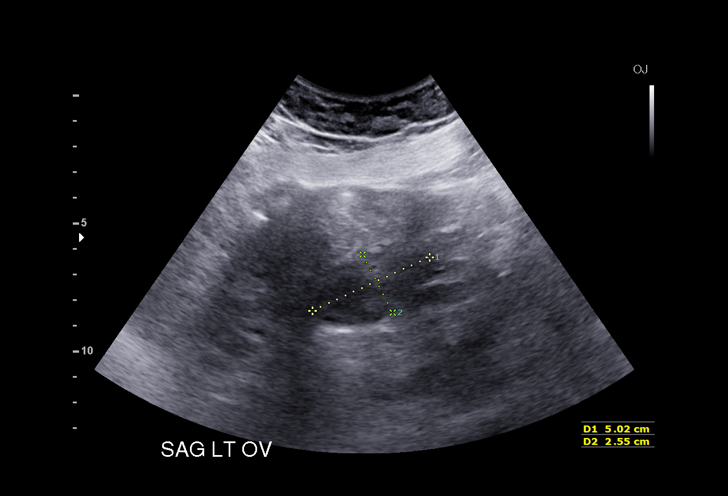
[im 21/50]
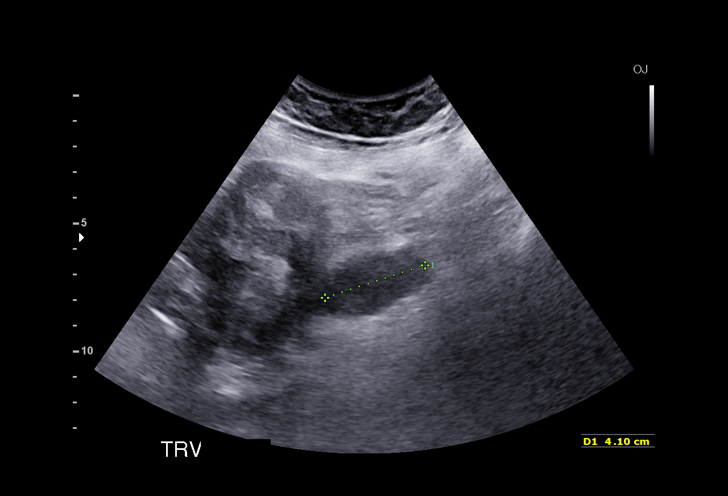
[im 25/50]
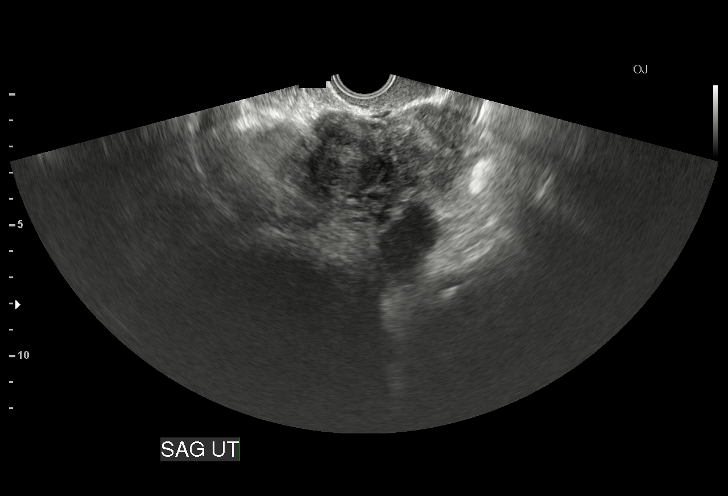
[im 29/50]
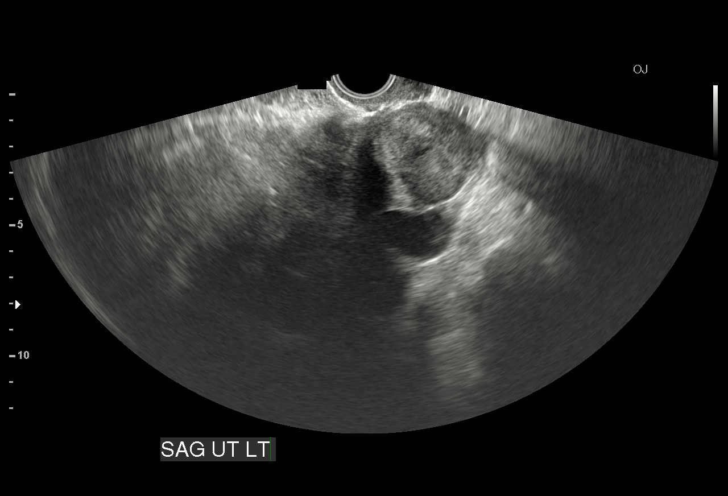
[im 31/50]
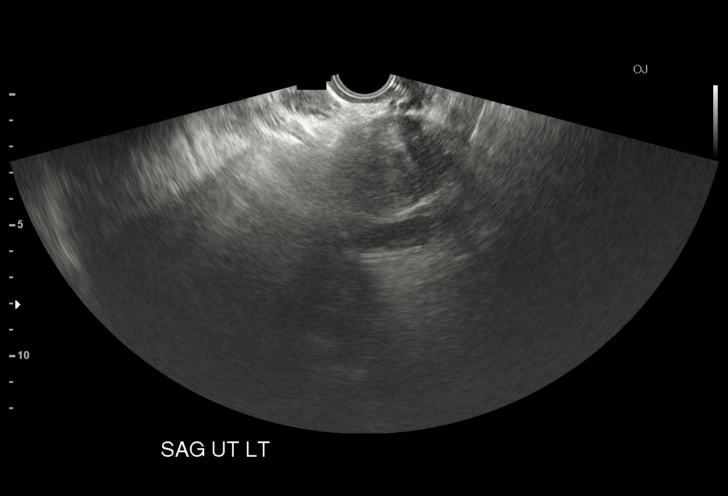
[im 35/50]
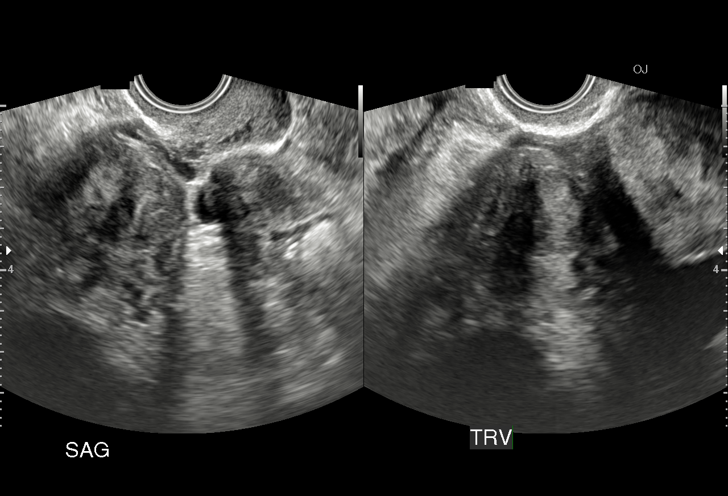
[im 39/50]
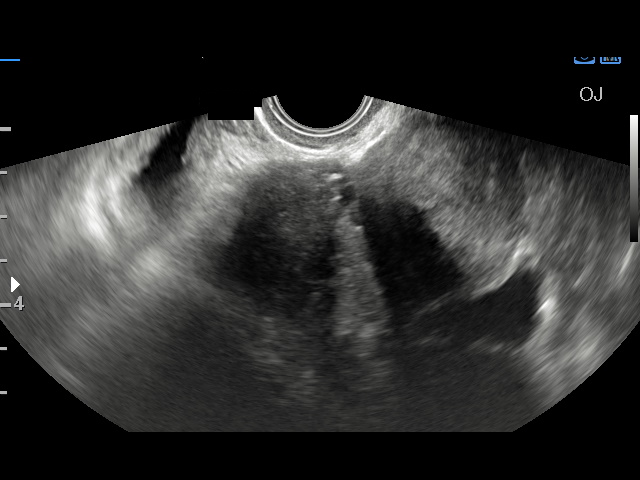
[im 41/50]
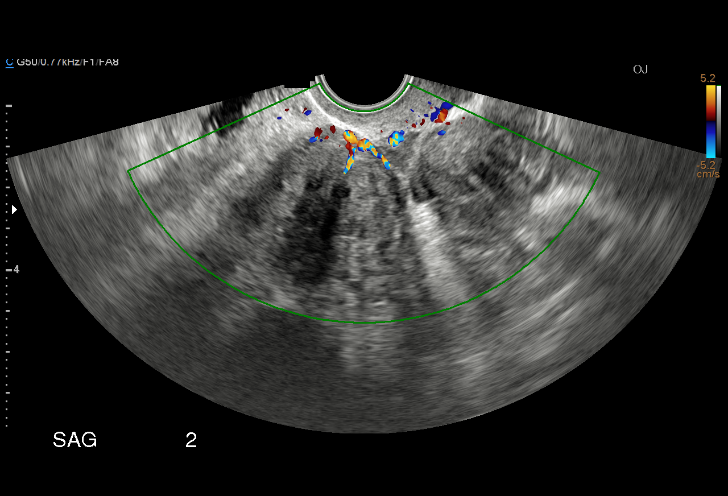
[im 45/50]
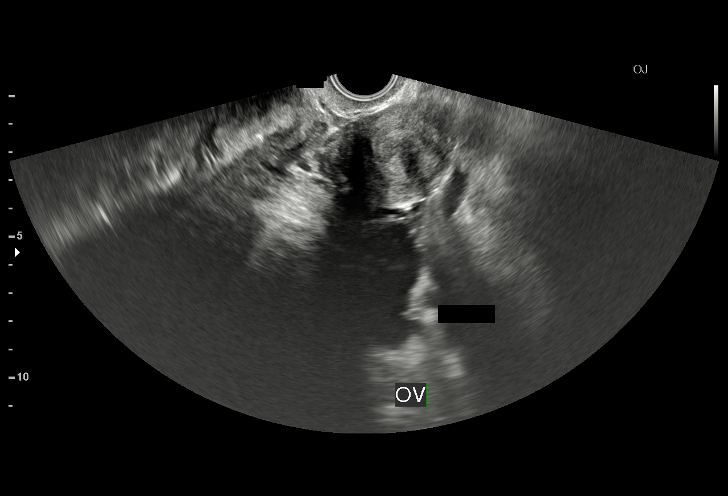
[im 50/50]
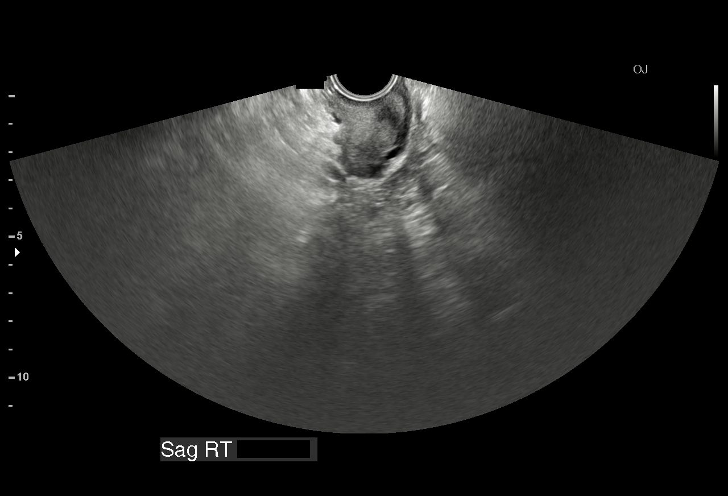

[15 of 25 positions shown; findings below may reference images not displayed]

FINDINGS: Uterus

Measurements: 15.6 x 8.9 x 11.2 cm. Numerous uterine fibroids,
including a dominant 7.5 x 8.3 x 7.7 cm subserosal anterior fundal
fibroid, a 4.6 x 4.2 x 4.6 cm subserosal anterior uterine body
fibroid, and a 3.9 x 4.4 x 3.9 cm exophytic lower uterine segment/
cervical fibroid.

Endometrium

Poorly visualized/distorted by multiple uterine fibroids.

Right ovary

Not discretely visualized.

Left ovary

Measurements: 3.3 x 3.5 x 2.8 cm. Suspected hydrosalpinx.

Other findings

No abnormal free fluid.
IMPRESSION: Multiple uterine fibroids, including a dominant 8.3 cm subserosal
anterior fundal fibroid.

Endometrial complex is poorly visualized/distorted by multiple
uterine fibroids.

Right ovary is not discretely visualized.

Suspected left hydrosalpinx.

## 2017-06-06 ENCOUNTER — Ambulatory Visit (INDEPENDENT_AMBULATORY_CARE_PROVIDER_SITE_OTHER): Payer: Self-pay | Admitting: Physician Assistant

## 2017-06-06 ENCOUNTER — Ambulatory Visit (HOSPITAL_COMMUNITY): Payer: No Typology Code available for payment source | Attending: Physician Assistant

## 2017-06-07 ENCOUNTER — Encounter (HOSPITAL_COMMUNITY): Payer: No Typology Code available for payment source

## 2017-06-14 ENCOUNTER — Ambulatory Visit (INDEPENDENT_AMBULATORY_CARE_PROVIDER_SITE_OTHER): Payer: Self-pay | Admitting: Physician Assistant

## 2017-07-05 ENCOUNTER — Encounter (HOSPITAL_COMMUNITY): Payer: Self-pay | Attending: Physician Assistant

## 2017-07-06 ENCOUNTER — Encounter (HOSPITAL_COMMUNITY): Admission: RE | Admit: 2017-07-06 | Payer: Self-pay | Source: Ambulatory Visit

## 2017-07-09 ENCOUNTER — Ambulatory Visit (INDEPENDENT_AMBULATORY_CARE_PROVIDER_SITE_OTHER): Payer: Self-pay | Admitting: Physician Assistant

## 2017-07-31 ENCOUNTER — Encounter: Payer: Self-pay | Admitting: Internal Medicine

## 2017-09-19 ENCOUNTER — Encounter (HOSPITAL_COMMUNITY): Payer: No Typology Code available for payment source

## 2017-09-20 ENCOUNTER — Encounter (HOSPITAL_COMMUNITY): Payer: No Typology Code available for payment source

## 2017-10-01 ENCOUNTER — Encounter (HOSPITAL_COMMUNITY): Payer: No Typology Code available for payment source | Attending: Physician Assistant

## 2017-10-02 ENCOUNTER — Encounter (HOSPITAL_COMMUNITY): Payer: No Typology Code available for payment source

## 2018-05-10 ENCOUNTER — Other Ambulatory Visit (INDEPENDENT_AMBULATORY_CARE_PROVIDER_SITE_OTHER): Payer: Self-pay | Admitting: Physician Assistant

## 2018-05-10 DIAGNOSIS — I10 Essential (primary) hypertension: Secondary | ICD-10-CM

## 2018-05-10 NOTE — Telephone Encounter (Signed)
FWD to PCP. Taliah Porche S Punam Broussard, CMA  

## 2018-06-05 ENCOUNTER — Ambulatory Visit (INDEPENDENT_AMBULATORY_CARE_PROVIDER_SITE_OTHER): Payer: Self-pay | Admitting: Physician Assistant

## 2018-06-07 ENCOUNTER — Ambulatory Visit (INDEPENDENT_AMBULATORY_CARE_PROVIDER_SITE_OTHER): Payer: Self-pay | Admitting: Physician Assistant

## 2018-07-03 ENCOUNTER — Ambulatory Visit (INDEPENDENT_AMBULATORY_CARE_PROVIDER_SITE_OTHER): Payer: Self-pay | Admitting: Nurse Practitioner

## 2018-07-24 ENCOUNTER — Ambulatory Visit (INDEPENDENT_AMBULATORY_CARE_PROVIDER_SITE_OTHER): Payer: Self-pay | Admitting: Primary Care

## 2018-08-10 ENCOUNTER — Ambulatory Visit (HOSPITAL_COMMUNITY)
Admission: EM | Admit: 2018-08-10 | Discharge: 2018-08-10 | Disposition: A | Discharge status: Home / Self Care | Payer: Self-pay | Attending: Family Medicine | Admitting: Family Medicine

## 2018-08-10 ENCOUNTER — Ambulatory Visit (INDEPENDENT_AMBULATORY_CARE_PROVIDER_SITE_OTHER): Payer: Self-pay

## 2018-08-10 ENCOUNTER — Encounter (HOSPITAL_COMMUNITY): Payer: Self-pay | Admitting: Emergency Medicine

## 2018-08-10 DIAGNOSIS — M79642 Pain in left hand: Secondary | ICD-10-CM

## 2018-08-10 DIAGNOSIS — S62617A Displaced fracture of proximal phalanx of left little finger, initial encounter for closed fracture: Secondary | ICD-10-CM

## 2018-08-10 DIAGNOSIS — W19XXXA Unspecified fall, initial encounter: Secondary | ICD-10-CM

## 2018-08-10 MED ORDER — IBUPROFEN 600 MG PO TABS: 600.0000 mg | ORAL_TABLET | Freq: Four times a day (QID) | ORAL | 0 refills | Status: DC | PRN

## 2018-08-10 MED ORDER — TRAMADOL HCL 50 MG PO TABS: 50.0000 mg | ORAL_TABLET | Freq: Four times a day (QID) | ORAL | 0 refills | Status: AC | PRN

## 2018-08-10 NOTE — ED Notes (Signed)
Ortho Tech was paged and reached; on their way up to splint patient.

## 2018-08-10 NOTE — ED Triage Notes (Signed)
Pt here for left hand pain after fall x 2 days ago

## 2018-08-10 NOTE — Discharge Instructions (Signed)
Use anti-inflammatories for pain/swelling. You may take up to 800 mg Ibuprofen every 8 hours with food. You may supplement Ibuprofen with Tylenol [phone] mg every 8 hours.   May use tramadol for severe pain/nighttime, will cause drowsiness, do not drive or work after taking  Follow up with orthopedics

## 2018-08-10 NOTE — ED Provider Notes (Signed)
[facility]    CSN: [id number] Arrival date & time: 08/10/18  1549     History   Chief Complaint Chief Complaint  Patient presents with  . Hand Pain    HPI Debra [name] is a 53 y.o. female history of hypertension, sleep apnea, presenting today for evaluation of left hand pain.  Patient fell approximately 2 days ago and landed on grass.  She fell on outstretched hand.  Since she has had pain and difficulty moving her fourth and fifth fingers.  Does note that she has previously injured her pinky finger when she was younger.  Is having some mild numbness and tingling extending more proximally in the forearm.  HPI  Past Medical History:  Diagnosis Date  . Acid reflux   . Bipolar disorder ([place])   . Hypertension   . MDD (major depressive disorder), recurrent episode, severe ([place]) 01/06/2014  . Suicidal ideation 01/03/2014  . TROCHANTERIC BURSITIS, BILATERAL 01/17/2007   Qualifier: Diagnosis of  By: Amil Amen MD, Benjamine Mola    . Tylenol overdose 01/02/2014    Patient Active Problem List   Diagnosis Date Noted  . Alcohol intoxication ([place]) 01/03/2014  . Depression 01/03/2014  . GOITER, MULTINODULAR 02/11/2007  . OBESITY 02/11/2007  . DISORDER, BIPOLAR NOS 02/11/2007  . HYPERTENSION, BENIGN ESSENTIAL 02/11/2007  . GERD 02/11/2007  . BACK PAIN, LUMBAR 02/11/2007  . SLEEP APNEA 02/11/2007  . Fibroids 01/25/2007    Past Surgical History:  Procedure Laterality Date  . BREAST SURGERY     cyst on right breast removed.  . TONSILLECTOMY      OB History    Gravida  0   Para  0   Term  0   Preterm  0   AB  0   Living  0     SAB  0   TAB  0   Ectopic  0   Multiple  0   Live Births               Home Medications    Prior to Admission medications   Medication Sig Start Date End Date Taking? Authorizing Provider  amLODipine (NORVASC) 5 MG tablet TAKE 1 TABLET BY MOUTH ONCE DAILY 05/13/18   Clent Demark, PA-C  atenolol  (TENORMIN) 50 MG tablet TAKE 1 TABLET BY MOUTH ONCE DAILY 05/13/18   Clent Demark, PA-C  hydrochlorothiazide (HYDRODIURIL) 25 MG tablet TAKE 1 TABLET BY MOUTH ONCE DAILY 05/13/18   Clent Demark, PA-C  ibuprofen (ADVIL,MOTRIN) 600 MG tablet Take 1 tablet (600 mg total) by mouth every 6 (six) hours as needed. 08/10/18   Wieters, Hallie C, PA-C  Ibuprofen-Diphenhydramine HCl (ADVIL PM) 200-25 MG CAPS Take 2 tablets by mouth at bedtime as needed (sleep/pain).    [provider]  methimazole (TAPAZOLE) 10 MG tablet Take 1 tablet (10 mg total) by mouth daily. 04/05/17   Clent Demark, PA-C  risperiDONE (RISPERDAL) 0.5 MG tablet Take 1 tablet (0.5 mg total) by mouth at bedtime. 04/05/17   Clent Demark, PA-C  traMADol (ULTRAM) 50 MG tablet Take 1 tablet (50 mg total) by mouth every 6 (six) hours as needed for severe pain. 08/10/18   Wieters, Elesa Hacker, PA-C    Family History Family History  Problem Relation Age of Onset  . Diabetes Father   . Hypertension Father   . Cancer Mother   . Cancer Maternal Grandmother        ovarian  Social History Social History   Tobacco Use  . Smoking status: Current Every Day Smoker    Packs/day: 0.50  . Smokeless tobacco: Never Used  Substance Use Topics  . Alcohol use: No  . Drug use: No     Allergies   Lisinopril   Review of Systems Review of Systems  Constitutional: Negative for fatigue and fever.  Eyes: Negative for visual disturbance.  Respiratory: Negative for shortness of breath.   Cardiovascular: Negative for chest pain.  Gastrointestinal: Negative for abdominal pain, nausea and vomiting.  Musculoskeletal: Positive for arthralgias and joint swelling.  Skin: Negative for color change, rash and wound.  Neurological: Negative for dizziness, weakness, light-headedness and headaches.     Physical Exam Triage Vital Signs ED Triage Vitals [08/10/18 1614]  Enc Vitals Group     BP (!) 151/102     Pulse Rate  (!) 108     Resp 18     Temp 98.3 F (36.8 C)     Temp Source Oral     SpO2 96 %     Weight      Height      Head Circumference      Peak Flow      Pain Score      Pain Loc      Pain Edu?      Excl. in [place]?    No data found.  Updated Vital Signs BP (!) 151/102 (BP Location: Right Arm)   Pulse (!) 108   Temp 98.3 F (36.8 C) (Oral)   Resp 18   LMP 07/24/2012   SpO2 96%   Visual Acuity Right Eye Distance:   Left Eye Distance:   Bilateral Distance:    Right Eye Near:   Left Eye Near:    Bilateral Near:     Physical Exam Vitals signs and nursing note reviewed.  Constitutional:      Appearance: She is well-developed.     Comments: No acute distress  HENT:     Head: Normocephalic and atraumatic.     Nose: Nose normal.  Eyes:     Conjunctiva/sclera: Conjunctivae normal.  Neck:     Musculoskeletal: Neck supple.  Cardiovascular:     Rate and Rhythm: Normal rate.  Pulmonary:     Effort: Pulmonary effort is normal. No respiratory distress.  Abdominal:     General: There is no distension.  Musculoskeletal: Normal range of motion.     Comments: Swelling mainly on fifth left finger, limited range of motion of fifth finger as well as at MCP/phalanx joint.  Tender at distal fourth and fifth metacarpal, full range of motion of fourth finger  No snuffbox tenderness, nontender at distal radius and ulna  Radial pulse 2+, cap refill less than 2 seconds  Skin:    General: Skin is warm and dry.  Neurological:     Mental Status: She is alert and oriented to person, place, and time.      UC Treatments / Results  Labs (all labs ordered are listed, but only abnormal results are displayed) Labs Reviewed - No data to display  EKG None  Radiology Dg Hand Complete Left  Result Date: 08/10/2018 CLINICAL DATA:  Fall with left hand pain. EXAM: LEFT HAND - COMPLETE 3+ VIEW COMPARISON:  None. FINDINGS: Three views study shows a comminuted transverse fracture at the base of  the little finger proximal phalanx. Mild apex anterior angulation evident. No definite extension of the fracture to the articular surface.  No other acute bony abnormality evident. Degenerative changes are noted in the first carpometacarpal joint. IMPRESSION: Comminuted fracture in the proximal metaphysis of the little finger proximal phalanx. Mild apex anterolateral angulation without definite extension of a fracture line to the articular surface. Electronically Signed   By: Misty Stanley M.D.   On: 08/10/2018 16:59    Procedures Procedures (including critical care time)  Medications Ordered in UC Medications - No data to display  Initial Impression / Assessment and Plan / UC Course  I have reviewed the triage vital signs and the nursing notes.  Pertinent labs & imaging results that were available during my care of the patient were reviewed by me and considered in my medical decision making (see chart for details).     Patient has fracture of the base of the proximal fifth phalanx on left hand, appear slightly angulated.  Will place an ulnar gutter and have follow-up with orthopedics.  Neurovascularly intact.Discussed strict return precautions. Patient verbalized understanding and is agreeable with plan.  Final Clinical Impressions(s) / UC Diagnoses   Final diagnoses:  Closed displaced fracture of proximal phalanx of left little finger, initial encounter     Discharge Instructions     Use anti-inflammatories for pain/swelling. You may take up to 800 mg Ibuprofen every 8 hours with food. You may supplement Ibuprofen with Tylenol [phone] mg every 8 hours.   May use tramadol for severe pain/nighttime, will cause drowsiness, do not drive or work after taking  Follow up with orthopedics    ED Prescriptions    Medication Sig Dispense Auth. Provider   ibuprofen (ADVIL,MOTRIN) 600 MG tablet Take 1 tablet (600 mg total) by mouth every 6 (six) hours as needed. 30 tablet Wieters, Hallie C,  PA-C   traMADol (ULTRAM) 50 MG tablet Take 1 tablet (50 mg total) by mouth every 6 (six) hours as needed for severe pain. 10 tablet Wieters, [place] C, PA-C     Controlled Substance Prescriptions [place] Controlled Substance Registry consulted? Yes, I have consulted the [place] Controlled Substances Registry for this patient, and feel the risk/benefit ratio today is favorable for proceeding with this prescription for a controlled substance.   Janith Lima, [place] 08/10/18 1713

## 2018-08-15 ENCOUNTER — Ambulatory Visit (INDEPENDENT_AMBULATORY_CARE_PROVIDER_SITE_OTHER): Payer: Self-pay | Admitting: Primary Care

## 2018-10-01 ENCOUNTER — Ambulatory Visit (HOSPITAL_COMMUNITY)
Admission: EM | Admit: 2018-10-01 | Discharge: 2018-10-01 | Disposition: A | Discharge status: Home / Self Care | Payer: Self-pay | Attending: Family Medicine | Admitting: Family Medicine

## 2018-10-01 ENCOUNTER — Other Ambulatory Visit: Payer: Self-pay

## 2018-10-01 ENCOUNTER — Ambulatory Visit (INDEPENDENT_AMBULATORY_CARE_PROVIDER_SITE_OTHER): Payer: Self-pay

## 2018-10-01 ENCOUNTER — Encounter (HOSPITAL_COMMUNITY): Payer: Self-pay | Admitting: Emergency Medicine

## 2018-10-01 DIAGNOSIS — I10 Essential (primary) hypertension: Secondary | ICD-10-CM

## 2018-10-01 DIAGNOSIS — Z760 Encounter for issue of repeat prescription: Secondary | ICD-10-CM

## 2018-10-01 DIAGNOSIS — M79642 Pain in left hand: Secondary | ICD-10-CM

## 2018-10-01 DIAGNOSIS — F3131 Bipolar disorder, current episode depressed, mild: Secondary | ICD-10-CM

## 2018-10-01 MED ORDER — RISPERIDONE 0.5 MG PO TABS: 0.5000 mg | ORAL_TABLET | Freq: Every day | ORAL | 1 refills | Status: AC

## 2018-10-01 MED ORDER — MELOXICAM 15 MG PO TABS: 15.0000 mg | ORAL_TABLET | Freq: Every day | ORAL | 0 refills | Status: AC

## 2018-10-01 MED ORDER — HYDROCHLOROTHIAZIDE 25 MG PO TABS: 25.0000 mg | ORAL_TABLET | Freq: Every day | ORAL | 1 refills | Status: AC

## 2018-10-01 MED ORDER — AMLODIPINE BESYLATE 5 MG PO TABS: 5.0000 mg | ORAL_TABLET | Freq: Every day | ORAL | 1 refills | Status: AC

## 2018-10-01 MED ORDER — ATENOLOL 50 MG PO TABS: 50.0000 mg | ORAL_TABLET | Freq: Every day | ORAL | 1 refills | Status: AC

## 2018-10-01 NOTE — Discharge Instructions (Addendum)
Medicines are refilled Take the meloxicam with food,  this is an arthritis pain medicine Keep calling your doctor about refills and an appointment

## 2018-10-01 NOTE — ED Triage Notes (Signed)
Pt here requesting her BP medicines refilled, pt also states she was seen back in feb for fracture in her hand and never followed up with ortho. States still having hand pain.

## 2018-10-01 NOTE — ED Provider Notes (Signed)
[facility]    CSN: [id number] Arrival date & time: 10/01/18  9323     History   Chief Complaint Chief Complaint  Patient presents with  . Medication Refill  . Hand Pain    HPI Debra [name] is a 53 y.o. female.   HPI  Patient was seen here on 08/10/2018 for an injury and was diagnosed with a fractured finger.  She was put into an ulnar gutter brace and told to follow-up with orthopedics.  When she called the orthopedic office they wanted money to see her, and she states she was unable to afford the appointment.  She wore the brace until it fell apart.  She is here to have her finger rechecked because it is "not working right." She also has been unable to get through to her family practice doctor.  She has not been seen in a year.  They will not refill her medicines until she is seen.  They are not taking appointments because of the COVID-19 pandemic.  She is given refills of her blood pressure medications.  She states she has been off of her Tapazole.  She is also given risperidone. Her blood pressure and pulse are elevated today.  She states she has been out of her medicine for a few days.  She is not having any headache,Syncope , presyncope, or visual symptoms.  Past Medical History:  Diagnosis Date  . Acid reflux   . Bipolar disorder ([place])   . Hypertension   . MDD (major depressive disorder), recurrent episode, severe ([place]) 01/06/2014  . Suicidal ideation 01/03/2014  . TROCHANTERIC BURSITIS, BILATERAL 01/17/2007   Qualifier: Diagnosis of  By: Amil Amen MD, Benjamine Mola    . Tylenol overdose 01/02/2014    Patient Active Problem List   Diagnosis Date Noted  . Alcohol intoxication ([place]) 01/03/2014  . Depression 01/03/2014  . GOITER, MULTINODULAR 02/11/2007  . OBESITY 02/11/2007  . DISORDER, BIPOLAR NOS 02/11/2007  . HYPERTENSION, BENIGN ESSENTIAL 02/11/2007  . GERD 02/11/2007  . BACK PAIN, LUMBAR 02/11/2007  . SLEEP APNEA 02/11/2007  . Fibroids  01/25/2007    Past Surgical History:  Procedure Laterality Date  . BREAST SURGERY     cyst on right breast removed.  . TONSILLECTOMY      OB History    Gravida  0   Para  0   Term  0   Preterm  0   AB  0   Living  0     SAB  0   TAB  0   Ectopic  0   Multiple  0   Live Births               Home Medications    Prior to Admission medications   Medication Sig Start Date End Date Taking? Authorizing Provider  amLODipine (NORVASC) 5 MG tablet Take 1 tablet (5 mg total) by mouth daily. 10/01/18   Raylene Everts, MD  atenolol (TENORMIN) 50 MG tablet Take 1 tablet (50 mg total) by mouth daily. 10/01/18   Raylene Everts, MD  hydrochlorothiazide (HYDRODIURIL) 25 MG tablet Take 1 tablet (25 mg total) by mouth daily. 10/01/18   Raylene Everts, MD  meloxicam (MOBIC) 15 MG tablet Take 1 tablet (15 mg total) by mouth daily. Take with food 10/01/18   Raylene Everts, MD  methimazole (TAPAZOLE) 10 MG tablet Take 1 tablet (10 mg total) by mouth daily. Patient not taking: Reported on 10/01/2018 04/05/17   Altamease Oiler,  Lesli Albee, PA-C  risperiDONE (RISPERDAL) 0.5 MG tablet Take 1 tablet (0.5 mg total) by mouth at bedtime. 10/01/18   Raylene Everts, MD  traMADol (ULTRAM) 50 MG tablet Take 1 tablet (50 mg total) by mouth every 6 (six) hours as needed for severe pain. 08/10/18   Wieters, Elesa Hacker, PA-C    Family History Family History  Problem Relation Age of Onset  . Diabetes Father   . Hypertension Father   . Cancer Mother   . Cancer Maternal Grandmother        ovarian     Social History Social History   Tobacco Use  . Smoking status: Current Every Day Smoker    Packs/day: 0.50  . Smokeless tobacco: Never Used  Substance Use Topics  . Alcohol use: No  . Drug use: No     Allergies   Lisinopril   Review of Systems Review of Systems  Constitutional: Negative for chills and fever.  HENT: Negative for ear pain and sore throat.   Eyes: Negative for  pain and visual disturbance.  Respiratory: Negative for cough and shortness of breath.   Cardiovascular: Negative for chest pain and palpitations.  Gastrointestinal: Negative for abdominal pain and vomiting.  Genitourinary: Negative for dysuria and hematuria.  Musculoskeletal: Positive for arthralgias. Negative for back pain.  Skin: Negative for color change and rash.  Neurological: Negative for seizures and syncope.  All other systems reviewed and are negative.    Physical Exam Triage Vital Signs ED Triage Vitals [10/01/18 0900]  Enc Vitals Group     BP (!) 189/109-out of medicine     Pulse Rate (!) 113     Resp 16     Temp 98 F (36.7 C)     Temp src      SpO2 97 %     Weight      Height      Head Circumference      Peak Flow      Pain Score      Pain Loc      Pain Edu?      Excl. in [place]?    No data found.  Updated Vital Signs BP (!) 189/109   Pulse (!) 113   Temp 98 F (36.7 C)   Resp 16   LMP 07/24/2012   SpO2 97%      Physical Exam Constitutional:      General: She is not in acute distress.    Appearance: She is well-developed.  HENT:     Head: Normocephalic and atraumatic.  Eyes:     Conjunctiva/sclera: Conjunctivae normal.     Pupils: Pupils are equal, round, and reactive to light.  Neck:     Musculoskeletal: Normal range of motion.  Cardiovascular:     Rate and Rhythm: Regular rhythm. Tachycardia present.     Heart sounds: Normal heart sounds.  Pulmonary:     Effort: Pulmonary effort is normal. No respiratory distress.     Breath sounds: Normal breath sounds.  Abdominal:     General: There is no distension.     Palpations: Abdomen is soft.  Musculoskeletal: Normal range of motion.     Comments: The fifth finger on the left hand has an obvious dorsal angulation and inability to fully flex.  Skin:    General: Skin is warm and dry.  Neurological:     Mental Status: She is alert.      UC Treatments / Results  Labs (all labs  ordered are  listed, but only abnormal results are displayed) Labs Reviewed - No data to display  EKG None  Radiology Dg Hand Complete Left  Result Date: 10/01/2018 CLINICAL DATA:  Pain.  Recent fracture fifth proximal phalanx EXAM: LEFT HAND - COMPLETE 3+ VIEW COMPARISON:  August 10, 2018 FINDINGS: Frontal, oblique, and lateral views were obtained. There is a fracture the proximal aspect of the fifth proximal phalanx. There is healing response in this area. There remains mild impaction at the fracture site with dorsal angulation distally. No new fracture evident. No dislocation. Joint spaces appear normal. No evident erosive change. IMPRESSION: Fracture of the proximal aspect of the fifth proximal phalanx again noted with evidence of early callus formation. There remains a degree of impaction at the fracture site with dorsal angulation distally. No new fracture evident. No dislocation. Joint spaces appear unremarkable. Electronically Signed   By: Lowella Grip III M.D.   On: 10/01/2018 09:37    Procedures Procedures (including critical care time)  Medications Ordered in UC Medications - No data to display  Initial Impression / Assessment and Plan / UC Course  I have reviewed the triage vital signs and the nursing notes.  Pertinent labs & imaging results that were available during my care of the patient were reviewed by me and considered in my medical decision making (see chart for details).    The patient has a displaced fracture, now 2 months old, that is healed.  I told her in order to treat this she would have to see hand specialist and would likely need to be rebroken and then fixed with a pin or plate.  Patient is not at all interested in referral at this time. Final Clinical Impressions(s) / UC Diagnoses   Final diagnoses:  Essential hypertension  Medication refill     Discharge Instructions     Medicines are refilled Take the meloxicam with food,  this is an arthritis pain  medicine Keep calling your doctor about refills and an appointment   ED Prescriptions    Medication Sig Dispense Auth. Provider   amLODipine (NORVASC) 5 MG tablet Take 1 tablet (5 mg total) by mouth daily. 30 tablet Raylene Everts, MD   atenolol (TENORMIN) 50 MG tablet Take 1 tablet (50 mg total) by mouth daily. 30 tablet Raylene Everts, MD   hydrochlorothiazide (HYDRODIURIL) 25 MG tablet Take 1 tablet (25 mg total) by mouth daily. 30 tablet Raylene Everts, MD   risperiDONE (RISPERDAL) 0.5 MG tablet Take 1 tablet (0.5 mg total) by mouth at bedtime. 30 tablet Raylene Everts, MD   meloxicam (MOBIC) 15 MG tablet Take 1 tablet (15 mg total) by mouth daily. Take with food 30 tablet Raylene Everts, MD     Controlled Substance Prescriptions [place] Controlled Substance Registry consulted? Not Applicable   Raylene Everts, MD 10/01/18 1002

## 2018-10-22 ENCOUNTER — Inpatient Hospital Stay: Payer: No Typology Code available for payment source | Admitting: Primary Care

## 2018-10-30 ENCOUNTER — Inpatient Hospital Stay: Payer: No Typology Code available for payment source | Admitting: Primary Care

## 2018-12-04 ENCOUNTER — Ambulatory Visit (INDEPENDENT_AMBULATORY_CARE_PROVIDER_SITE_OTHER): Payer: No Typology Code available for payment source | Admitting: Primary Care

## 2018-12-16 ENCOUNTER — Ambulatory Visit (INDEPENDENT_AMBULATORY_CARE_PROVIDER_SITE_OTHER): Payer: No Typology Code available for payment source | Admitting: Primary Care

## 2018-12-24 ENCOUNTER — Ambulatory Visit (INDEPENDENT_AMBULATORY_CARE_PROVIDER_SITE_OTHER): Payer: No Typology Code available for payment source | Admitting: Primary Care

## 2019-01-20 ENCOUNTER — Ambulatory Visit (INDEPENDENT_AMBULATORY_CARE_PROVIDER_SITE_OTHER): Payer: No Typology Code available for payment source | Admitting: Primary Care

## 2019-04-02 ENCOUNTER — Ambulatory Visit (INDEPENDENT_AMBULATORY_CARE_PROVIDER_SITE_OTHER): Payer: No Typology Code available for payment source | Admitting: Primary Care

## 2019-04-23 ENCOUNTER — Ambulatory Visit (INDEPENDENT_AMBULATORY_CARE_PROVIDER_SITE_OTHER): Payer: No Typology Code available for payment source | Admitting: Primary Care

## 2019-07-09 ENCOUNTER — Ambulatory Visit (INDEPENDENT_AMBULATORY_CARE_PROVIDER_SITE_OTHER): Payer: No Typology Code available for payment source

## 2019-07-19 ENCOUNTER — Encounter (HOSPITAL_COMMUNITY): Payer: Self-pay

## 2019-07-19 ENCOUNTER — Other Ambulatory Visit: Payer: Self-pay

## 2019-07-19 ENCOUNTER — Ambulatory Visit (HOSPITAL_COMMUNITY)
Admission: EM | Admit: 2019-07-19 | Discharge: 2019-07-19 | Disposition: A | Discharge status: Home / Self Care | Payer: No Typology Code available for payment source | Attending: Family Medicine | Admitting: Family Medicine

## 2019-07-19 DIAGNOSIS — Z8249 Family history of ischemic heart disease and other diseases of the circulatory system: Secondary | ICD-10-CM | POA: Insufficient documentation

## 2019-07-19 DIAGNOSIS — Z20822 Contact with and (suspected) exposure to covid-19: Secondary | ICD-10-CM | POA: Insufficient documentation

## 2019-07-19 DIAGNOSIS — R52 Pain, unspecified: Secondary | ICD-10-CM

## 2019-07-19 DIAGNOSIS — F17210 Nicotine dependence, cigarettes, uncomplicated: Secondary | ICD-10-CM | POA: Insufficient documentation

## 2019-07-19 DIAGNOSIS — Z888 Allergy status to other drugs, medicaments and biological substances status: Secondary | ICD-10-CM | POA: Insufficient documentation

## 2019-07-19 DIAGNOSIS — Z833 Family history of diabetes mellitus: Secondary | ICD-10-CM | POA: Insufficient documentation

## 2019-07-19 DIAGNOSIS — R509 Fever, unspecified: Secondary | ICD-10-CM | POA: Insufficient documentation

## 2019-07-19 DIAGNOSIS — R0981 Nasal congestion: Secondary | ICD-10-CM | POA: Insufficient documentation

## 2019-07-19 DIAGNOSIS — R197 Diarrhea, unspecified: Secondary | ICD-10-CM

## 2019-07-19 LAB — GLUCOSE, CAPILLARY: Glucose-Capillary: 84 mg/dL (ref 70–99)

## 2019-07-19 LAB — CBG MONITORING, ED: Glucose-Capillary: 84 mg/dL (ref 70–99)

## 2019-07-19 NOTE — ED Triage Notes (Addendum)
Pt presents to UC with diarrhea, nasal congestion, headache,  body aches, chills and cough x 1 day.   Pt wants to check if she had diabetes; her father had the same symptoms when he was diagnosed with diabetes.

## 2019-07-19 NOTE — Discharge Instructions (Addendum)
You have been tested for COVID-19 today. °If your test returns positive, you will receive a phone call from [facility] regarding your results. °Negative test results are not called. °Both positive and negative results area always visible on MyChart. °If you do not have a MyChart account, sign up instructions are provided in your discharge papers. °Please do not hesitate to contact us should you have questions or concerns. ° °

## 2019-07-20 LAB — NOVEL CORONAVIRUS, NAA (HOSP ORDER, SEND-OUT TO REF LAB; TAT 18-24 HRS): SARS-CoV-2, NAA: NOT DETECTED

## 2019-07-21 NOTE — ED Provider Notes (Signed)
[facility]   [id number] 07/19/19 Arrival Time: [id number]  ASSESSMENT & PLAN:  1. Nasal congestion   2. Fever and chills      COVID-19 testing sent. See letter/work note on file for self-isolation guidelines. Blood sugar normal. OTC symptom care as needed.   Follow-up Information    [facility].   Specialty: Urgent Care Why: As needed. Contact information: [place] [phone]          Reviewed expectations re: course of current medical issues. Questions answered. Outlined signs and symptoms indicating need for more acute intervention. Patient verbalized understanding. After Visit Summary given.   SUBJECTIVE: History from: patient. Debra [name] is a 54 y.o. female who reports 1 day of loose stools, nasal congestion, cody aches. subj fever and chills. Also requests blood sugar check; FH of DM. Weight stable. No significant thirst or frequent urination. Known COVID-19 contact: none. Recent travel: none. Denies: difficulty breathing and headache. Normal PO intake without n/v/d.    OBJECTIVE:  Vitals:   07/19/19 1632  BP: (!) 149/86  Pulse: 70  Resp: 18  Temp: 98.1 F (36.7 C)  TempSrc: Oral  SpO2: 100%    General appearance: alert; no distress Eyes: PERRLA; EOMI; conjunctiva normal HENT: [place]; AT; nasal mucosa normal; oral mucosa normal Neck: supple  Lungs: speaks full sentences without difficulty; unlabored Extremities: no edema Skin: warm and dry Neurologic: normal gait Psychological: alert and cooperative; normal mood and affect  Labs:  Labs Reviewed  NOVEL CORONAVIRUS, NAA (HOSP ORDER, SEND-OUT TO REF LAB; TAT 18-24 HRS)  GLUCOSE, CAPILLARY  CBG MONITORING, ED     Allergies  Allergen Reactions  . Lisinopril Swelling    Of jaw/lower face and tongue    Past Medical History:  Diagnosis Date  . Acid reflux   . Bipolar disorder ([place])   .  Hypertension   . MDD (major depressive disorder), recurrent episode, severe ([place]) 01/06/2014  . Suicidal ideation 01/03/2014  . TROCHANTERIC BURSITIS, BILATERAL 01/17/2007   Qualifier: Diagnosis of  By: Amil Amen MD, Benjamine Mola    . Tylenol overdose 01/02/2014   Social History   Socioeconomic History  . Marital status: Married    Spouse name: Not on file  . Number of children: Not on file  . Years of education: Not on file  . Highest education level: Not on file  Occupational History  . Not on file  Tobacco Use  . Smoking status: Current Every Day Smoker    Packs/day: 0.50  . Smokeless tobacco: Never Used  Substance and Sexual Activity  . Alcohol use: No  . Drug use: No  . Sexual activity: Never    Birth control/protection: None  Other Topics Concern  . Not on file  Social History Narrative  . Not on file   Social Determinants of Health   Financial Resource Strain:   . Difficulty of Paying Living Expenses: Not on file  Food Insecurity:   . Worried About Charity fundraiser in the Last Year: Not on file  . Ran Out of Food in the Last Year: Not on file  Transportation Needs:   . Lack of Transportation (Medical): Not on file  . Lack of Transportation (Non-Medical): Not on file  Physical Activity:   . Days of Exercise per Week: Not on file  . Minutes of Exercise per Session: Not on file  Stress:   . Feeling of Stress : Not on  file  Social Connections:   . Frequency of Communication with Friends and Family: Not on file  . Frequency of Social Gatherings with Friends and Family: Not on file  . Attends Religious Services: Not on file  . Active Member of Clubs or Organizations: Not on file  . Attends Archivist Meetings: Not on file  . Marital Status: Not on file  Intimate Partner Violence:   . Fear of Current or Ex-Partner: Not on file  . Emotionally Abused: Not on file  . Physically Abused: Not on file  . Sexually Abused: Not on file   Family History  Problem  Relation Age of Onset  . Diabetes Father   . Hypertension Father   . Cancer Mother   . Cancer Maternal Grandmother        ovarian    Past Surgical History:  Procedure Laterality Date  . BREAST SURGERY     cyst on right breast removed.  Evonnie Dawes, MD 07/21/19 [phone]

## 2019-08-23 ENCOUNTER — Ambulatory Visit: Payer: No Typology Code available for payment source | Attending: Internal Medicine

## 2019-08-23 DIAGNOSIS — Z23 Encounter for immunization: Secondary | ICD-10-CM | POA: Insufficient documentation

## 2019-08-23 NOTE — Progress Notes (Signed)
   Covid-19 Vaccination Clinic  Name:  Debra [name]    MRN: [id number] DOB: [birth year]  08/23/2019  Debra [name] was observed post Covid-19 immunization for 15 minutes without incident. She was provided with Vaccine Information Sheet and instruction to access the V-Safe system.   Debra [name] was instructed to call 911 with any severe reactions post vaccine: [name] Difficulty breathing  . Swelling of face and throat  . A fast heartbeat  . A bad rash all over body  . Dizziness and weakness   Immunizations Administered    Name Date Dose VIS Date Route   Pfizer COVID-19 Vaccine 08/23/2019 10:24 AM 0.3 mL 05/30/2019 Intramuscular   Manufacturer: [place]   Lot: [id number]   [place]: [id number]

## 2019-09-13 ENCOUNTER — Ambulatory Visit: Payer: No Typology Code available for payment source | Attending: Internal Medicine

## 2019-10-01 ENCOUNTER — Ambulatory Visit: Payer: No Typology Code available for payment source | Attending: Internal Medicine

## 2019-10-01 DIAGNOSIS — Z23 Encounter for immunization: Secondary | ICD-10-CM

## 2019-10-01 NOTE — Progress Notes (Signed)
   Covid-19 Vaccination Clinic  Name:  Debra [name]    MRN: [id number] DOB: 27-Nov-1965  10/01/2019  Ms. [name] was observed post Covid-19 immunization for 15 minutes without incident. She was provided with Vaccine Information Sheet and instruction to access the V-Safe system.   Ms. [name] was instructed to call 911 with any severe reactions post vaccine: [name] Difficulty breathing  . Swelling of face and throat  . A fast heartbeat  . A bad rash all over body  . Dizziness and weakness   Immunizations Administered    Name Date Dose VIS Date Route   Pfizer COVID-19 Vaccine 10/01/2019 11:49 AM 0.3 mL 05/30/2019 Intramuscular   Manufacturer: [place]   Lot: [id number]   [place]: [id number]

## 2019-12-05 ENCOUNTER — Ambulatory Visit: Payer: No Typology Code available for payment source | Admitting: Obstetrics & Gynecology

## 2020-08-23 ENCOUNTER — Ambulatory Visit: Payer: Self-pay | Attending: Nurse Practitioner | Admitting: Nurse Practitioner

## 2020-08-23 ENCOUNTER — Other Ambulatory Visit: Payer: Self-pay

## 2020-10-25 ENCOUNTER — Ambulatory Visit (INDEPENDENT_AMBULATORY_CARE_PROVIDER_SITE_OTHER): Payer: No Typology Code available for payment source | Admitting: Primary Care

## 2021-01-05 ENCOUNTER — Ambulatory Visit (INDEPENDENT_AMBULATORY_CARE_PROVIDER_SITE_OTHER): Payer: No Typology Code available for payment source | Admitting: Primary Care

## 2021-11-08 ENCOUNTER — Ambulatory Visit (INDEPENDENT_AMBULATORY_CARE_PROVIDER_SITE_OTHER): Payer: Self-pay | Admitting: Primary Care

## 2022-01-17 ENCOUNTER — Ambulatory Visit (INDEPENDENT_AMBULATORY_CARE_PROVIDER_SITE_OTHER): Payer: Self-pay | Admitting: Primary Care

## 2022-03-24 ENCOUNTER — Ambulatory Visit (INDEPENDENT_AMBULATORY_CARE_PROVIDER_SITE_OTHER): Payer: Self-pay | Admitting: Primary Care

## 2022-05-25 ENCOUNTER — Ambulatory Visit (INDEPENDENT_AMBULATORY_CARE_PROVIDER_SITE_OTHER): Payer: Self-pay | Admitting: Primary Care

## 2022-07-09 ENCOUNTER — Emergency Department (HOSPITAL_COMMUNITY)
Admission: EM | Admit: 2022-07-09 | Discharge: 2022-07-09 | Disposition: A | Discharge status: Home / Self Care | Payer: No Typology Code available for payment source | Attending: Emergency Medicine | Admitting: Emergency Medicine

## 2022-07-09 DIAGNOSIS — R4781 Slurred speech: Secondary | ICD-10-CM | POA: Insufficient documentation

## 2022-07-09 DIAGNOSIS — F10129 Alcohol abuse with intoxication, unspecified: Secondary | ICD-10-CM | POA: Diagnosis not present

## 2022-07-09 DIAGNOSIS — F10920 Alcohol use, unspecified with intoxication, uncomplicated: Secondary | ICD-10-CM

## 2022-07-09 DIAGNOSIS — R000 Tachycardia, unspecified: Secondary | ICD-10-CM | POA: Diagnosis not present

## 2022-07-09 DIAGNOSIS — F10929 Alcohol use, unspecified with intoxication, unspecified: Secondary | ICD-10-CM | POA: Insufficient documentation

## 2022-07-09 DIAGNOSIS — R4182 Altered mental status, unspecified: Secondary | ICD-10-CM | POA: Insufficient documentation

## 2022-07-09 DIAGNOSIS — Y908 Blood alcohol level of 240 mg/100 ml or more: Secondary | ICD-10-CM | POA: Insufficient documentation

## 2022-07-09 LAB — COMPREHENSIVE METABOLIC PANEL
ALT: 42 U/L (ref 0–44)
AST: 50 U/L — ABNORMAL HIGH (ref 15–41)
Albumin: 4 g/dL (ref 3.5–5.0)
Alkaline Phosphatase: 191 U/L — ABNORMAL HIGH (ref 38–126)
Anion gap: 7 (ref 5–15)
BUN: 44 mg/dL — ABNORMAL HIGH (ref 6–20)
CO2: 18 mmol/L — ABNORMAL LOW (ref 22–32)
Calcium: 8.4 mg/dL — ABNORMAL LOW (ref 8.9–10.3)
Chloride: 113 mmol/L — ABNORMAL HIGH (ref 98–111)
Creatinine, Ser: 2.11 mg/dL — ABNORMAL HIGH (ref 0.44–1.00)
GFR, Estimated: 27 mL/min — ABNORMAL LOW (ref >60)
Glucose, Bld: 109 mg/dL — ABNORMAL HIGH (ref 70–99)
Potassium: 4.9 mmol/L (ref 3.5–5.1)
Sodium: 138 mmol/L (ref 135–145)
Total Bilirubin: 0.1 mg/dL — ABNORMAL LOW (ref 0.3–1.2)
Total Protein: 8.1 g/dL (ref 6.5–8.1)

## 2022-07-09 LAB — RAPID URINE DRUG SCREEN, HOSP PERFORMED
Amphetamines: NOT DETECTED
Barbiturates: NOT DETECTED
Benzodiazepines: NOT DETECTED
Cocaine: NOT DETECTED
Opiates: NOT DETECTED
Tetrahydrocannabinol: NOT DETECTED

## 2022-07-09 LAB — CBC WITH DIFFERENTIAL/PLATELET
Abs Immature Granulocytes: 0.02 10*3/uL (ref 0.00–0.07)
Basophils Absolute: 0.1 10*3/uL (ref 0.0–0.1)
Basophils Relative: 1 %
Eosinophils Absolute: 0.4 10*3/uL (ref 0.0–0.5)
Eosinophils Relative: 7 %
HCT: 41.9 % (ref 36.0–46.0)
Hemoglobin: 13.1 g/dL (ref 12.0–15.0)
Immature Granulocytes: 0 %
Lymphocytes Relative: 36 %
Lymphs Abs: 1.9 10*3/uL (ref 0.7–4.0)
MCH: 31.2 pg (ref 26.0–34.0)
MCHC: 31.3 g/dL (ref 30.0–36.0)
MCV: 99.8 fL (ref 80.0–100.0)
Monocytes Absolute: 0.5 10*3/uL (ref 0.1–1.0)
Monocytes Relative: 10 %
Neutro Abs: 2.4 10*3/uL (ref 1.7–7.7)
Neutrophils Relative %: 46 %
Platelets: 154 10*3/uL (ref 150–400)
RBC: 4.2 MIL/uL (ref 3.87–5.11)
RDW: 12.9 % (ref 11.5–15.5)
WBC: 5.3 10*3/uL (ref 4.0–10.5)
nRBC: 0 % (ref 0.0–0.2)

## 2022-07-09 LAB — ETHANOL: Alcohol, Ethyl (B): 370 mg/dL (ref <10)

## 2022-07-09 MED ORDER — LACTATED RINGERS IV BOLUS
1000.0000 mL | Freq: Once | INTRAVENOUS | Status: AC
  Administered 2022-07-09: 1000 mL via INTRAVENOUS

## 2022-07-09 MED ORDER — ZIPRASIDONE MESYLATE 20 MG IM SOLR
20.0000 mg | Freq: Once | INTRAMUSCULAR | Status: AC
  Administered 2022-07-09: 20 mg via INTRAMUSCULAR

## 2022-07-09 NOTE — ED Triage Notes (Signed)
[facility] brought pt in. She was intoxicated when EMS got there. Pt stated that she is drinking to cope with husband's recent diagnosis of cancer.  Husband was at home but was unable to help pt back in the home. Husband stated that he will get a ride and get to the hospital.  HR 126  SPO 2 94%

## 2022-07-09 NOTE — ED Notes (Signed)
Pt placed on 5L [place] after desatting into high 80's while sleeping.

## 2022-07-09 NOTE — ED Notes (Signed)
Patient's husband called; patient update given.

## 2022-07-09 NOTE — ED Provider Notes (Signed)
Debra [name] Provider Note   CSN: [id number] Arrival date & time: 07/09/22  1631     History Chief Complaint  Patient presents with   Alcohol Intoxication    HPI Debra [name] is a 57 y.o. female presenting for altered mental status.  Patient found laying in the yard when EMS was called by the husband.  Apparently the husband was recently diagnosed with cancer in the patient's wife was drinking copious months of alcohol as a grieving mechanism.  She appeared grossly intoxicated per EMS.  Patient reported alcohol use earlier today to EMS.  Patient with slurred speech, screaming that she would like to have her husband around her. She denies fevers chills nausea vomiting syncope shortness of breath..   Patient's recorded medical, surgical, social, medication list and allergies were reviewed in the Snapshot window as part of the initial history.   Review of Systems   Review of Systems  Unable to perform ROS: Psychiatric disorder    Physical Exam Updated Vital Signs BP (!) 177/100 (BP Location: Left Arm)   Pulse (!) 113   Temp 98 F (36.7 C) (Oral)   Resp 20   LMP 07/24/2012   SpO2 98%  Physical Exam Vitals and nursing note reviewed.  Constitutional:      General: She is not in acute distress.    Appearance: She is well-developed.  HENT:     Head: Normocephalic and atraumatic.  Eyes:     Conjunctiva/sclera: Conjunctivae normal.  Cardiovascular:     Rate and Rhythm: Normal rate and regular rhythm.     Heart sounds: No murmur heard. Pulmonary:     Effort: Pulmonary effort is normal. No respiratory distress.     Breath sounds: Normal breath sounds.  Abdominal:     General: There is no distension.     Palpations: Abdomen is soft.     Tenderness: There is no abdominal tenderness. There is no right CVA tenderness or left CVA tenderness.  Musculoskeletal:        General: No swelling or tenderness. Normal range of  motion.     Cervical back: Neck supple.  Skin:    General: Skin is warm and dry.  Neurological:     General: No focal deficit present.     Mental Status: She is alert and oriented to person, place, and time. Mental status is at baseline.     Cranial Nerves: No cranial nerve deficit.      ED Course/ Medical Decision Making/ A&P Clinical Course as of 07/09/22 2050  Nancy Fetter Jul 09, 2022  1655 Patient not tolerating medical interventions.  She is swinging at staff members, trying to rip out of her ER stretcher.  She refuses to stay seated despite having unstable gait.  Patient was attempted to be redirected multiple times. Patient consented to treatment with ziprasidone for her acute agitation. [CC]  1815 Reeval again.  In no acute distress. [CC]    Clinical Course User Index [CC] Tretha Sciara, MD    Procedures Procedures   Medications Ordered in ED Medications  ziprasidone (GEODON) injection 20 mg (20 mg Intramuscular Given 07/09/22 1648)  lactated ringers bolus 1,000 mL (0 mLs Intravenous Stopped 07/09/22 1819)  lactated ringers bolus 1,000 mL (1,000 mLs Intravenous New Bag/Given 07/09/22 1930)    Medical Decision Making:    Jadzia Ibsen [name] is a 57 y.o. female who presented to the ED today with intoxication detailed above.     Complete  initial physical exam performed, notably the patient  was disoriented.  Did not know where she was.   Screaming that she wanted to be with her husband and refused to stay in her stretcher trying to climb out of her stretcher despite being strapped in by EMS. Patient refused to transfer to hospital stretcher refused to participate in exam or provide any history.  Patient wailing out, attempting to strike at staff members.  Informed patient that she was intoxicated and that she needed to be treated and patient consented for Geodon treatment  Patient consented to medical treatment after administration of ziprasidone allowing for complete  evaluation.  After 4 hours of observation, patient began to return to her mental status baseline. She does endorse extensive alcohol use earlier today.  Denies any fevers chills nausea vomiting syncope shortness of breath.  States she is grieving her spouse who is sick.  Reviewed and confirmed nursing documentation for past medical history, family history, social history.     Patient observed in emergency department for total of 6 hours with improvement in mental status.  Appears to have metabolized her toxic counts.  Given gross improvement, patient is stable for discharge at this time with plan for patient to be returned to care for family with strict return precautions should her symptoms worsen again.  Disposition:  I have considered need for hospitalization, however, considering all of the above, I believe this patient is stable for discharge at this time.  Patient/family educated about specific return precautions for given chief complaint and symptoms.  Patient/family educated about follow-up with PCP.     Patient/family expressed understanding of return precautions and need for follow-up. Patient spoken to regarding all imaging and laboratory results and appropriate follow up for these results. All education provided in verbal form with additional information in written form. Time was allowed for answering of patient questions. Patient discharged.    Emergency Department Medication Summary:   Medications  ziprasidone (GEODON) injection 20 mg (20 mg Intramuscular Given 07/09/22 1648)  lactated ringers bolus 1,000 mL (0 mLs Intravenous Stopped 07/09/22 1819)  lactated ringers bolus 1,000 mL (1,000 mLs Intravenous New Bag/Given 07/09/22 1930)        Clinical Impression: No diagnosis found.   Data Unavailable   Final Clinical Impression(s) / ED Diagnoses Final diagnoses:  None    Rx / DC Orders ED Discharge Orders     None         Tretha Sciara, MD 07/09/22 2217

## 2022-07-09 NOTE — ED Notes (Signed)
Patient a&o x4, and ambulatory. Pt states she spoke to her husband and will call a cab and wait in the lobby.

## 2022-07-28 ENCOUNTER — Ambulatory Visit (INDEPENDENT_AMBULATORY_CARE_PROVIDER_SITE_OTHER): Payer: Medicaid Other | Admitting: Primary Care

## 2022-09-14 ENCOUNTER — Ambulatory Visit (INDEPENDENT_AMBULATORY_CARE_PROVIDER_SITE_OTHER): Payer: Medicaid Other | Admitting: Primary Care

## 2022-11-20 ENCOUNTER — Telehealth (INDEPENDENT_AMBULATORY_CARE_PROVIDER_SITE_OTHER): Payer: Self-pay | Admitting: Primary Care

## 2022-11-20 NOTE — Telephone Encounter (Signed)
Phone not working. Attempt to call pt 6/3 10:16am

## 2022-11-21 ENCOUNTER — Ambulatory Visit (INDEPENDENT_AMBULATORY_CARE_PROVIDER_SITE_OTHER): Payer: Medicaid Other | Admitting: Primary Care

## 2023-01-26 ENCOUNTER — Telehealth (INDEPENDENT_AMBULATORY_CARE_PROVIDER_SITE_OTHER): Payer: Self-pay | Admitting: Primary Care

## 2023-01-26 NOTE — Telephone Encounter (Signed)
Called pt but number listed was unavailable

## 2023-01-29 ENCOUNTER — Ambulatory Visit (INDEPENDENT_AMBULATORY_CARE_PROVIDER_SITE_OTHER): Payer: 59 | Admitting: Primary Care

## 2023-01-29 ENCOUNTER — Telehealth (INDEPENDENT_AMBULATORY_CARE_PROVIDER_SITE_OTHER): Payer: Self-pay | Admitting: Primary Care

## 2023-01-29 NOTE — Telephone Encounter (Signed)
Pt phone is unavailable ;
